# Patient Record
Sex: Female | Born: 1945 | Race: Black or African American | Hispanic: No | Marital: Married | Smoking: Never smoker
Health system: Southern US, Community
[De-identification: ages and names within clinical notes are randomized; demographics above are authoritative.]

## PROBLEM LIST (undated history)

## (undated) DIAGNOSIS — F419 Anxiety disorder, unspecified: Secondary | ICD-10-CM

## (undated) DIAGNOSIS — R55 Syncope and collapse: Secondary | ICD-10-CM

## (undated) DIAGNOSIS — I480 Paroxysmal atrial fibrillation: Secondary | ICD-10-CM

## (undated) DIAGNOSIS — I499 Cardiac arrhythmia, unspecified: Secondary | ICD-10-CM

## (undated) DIAGNOSIS — Z950 Presence of cardiac pacemaker: Secondary | ICD-10-CM

## (undated) DIAGNOSIS — G4730 Sleep apnea, unspecified: Secondary | ICD-10-CM

## (undated) DIAGNOSIS — F329 Major depressive disorder, single episode, unspecified: Secondary | ICD-10-CM

## (undated) DIAGNOSIS — F3289 Other specified depressive episodes: Secondary | ICD-10-CM

## (undated) DIAGNOSIS — L509 Urticaria, unspecified: Secondary | ICD-10-CM

## (undated) DIAGNOSIS — I495 Sick sinus syndrome: Secondary | ICD-10-CM

## (undated) DIAGNOSIS — I519 Heart disease, unspecified: Secondary | ICD-10-CM

## (undated) HISTORY — DX: Heart disease, unspecified: I51.9

## (undated) HISTORY — DX: Presence of cardiac pacemaker: Z95.0

## (undated) HISTORY — DX: Syncope and collapse: R55

## (undated) HISTORY — DX: Major depressive disorder, single episode, unspecified: F32.9

## (undated) HISTORY — DX: Cardiac arrhythmia, unspecified: I49.9

## (undated) HISTORY — PX: PACEMAKER IMPLANT: EP1218

## (undated) HISTORY — DX: Other specified depressive episodes: F32.89

---

## 1987-12-11 HISTORY — PX: BREAST BIOPSY: SHX20

## 2002-12-10 HISTORY — PX: CARDIAC CATHETERIZATION: SHX172

## 2003-10-21 HISTORY — PX: INSERT / REPLACE / REMOVE PACEMAKER: SUR710

## 2012-05-06 ENCOUNTER — Ambulatory Visit: Payer: Self-pay | Admitting: Family Medicine

## 2012-05-06 ENCOUNTER — Emergency Department: Payer: Self-pay | Admitting: Emergency Medicine

## 2012-08-06 ENCOUNTER — Encounter: Payer: Self-pay | Admitting: Internal Medicine

## 2012-08-14 ENCOUNTER — Encounter: Payer: Self-pay | Admitting: Internal Medicine

## 2012-08-14 ENCOUNTER — Ambulatory Visit (INDEPENDENT_AMBULATORY_CARE_PROVIDER_SITE_OTHER): Payer: Medicare Other | Admitting: Internal Medicine

## 2012-08-14 VITALS — BP 130/82 | HR 60 | Ht 64.0 in | Wt 193.8 lb

## 2012-08-14 DIAGNOSIS — I442 Atrioventricular block, complete: Secondary | ICD-10-CM

## 2012-08-14 DIAGNOSIS — R55 Syncope and collapse: Secondary | ICD-10-CM

## 2012-08-14 DIAGNOSIS — I4891 Unspecified atrial fibrillation: Secondary | ICD-10-CM

## 2012-08-14 DIAGNOSIS — I495 Sick sinus syndrome: Secondary | ICD-10-CM | POA: Insufficient documentation

## 2012-08-14 DIAGNOSIS — T782XXA Anaphylactic shock, unspecified, initial encounter: Secondary | ICD-10-CM

## 2012-08-14 DIAGNOSIS — I499 Cardiac arrhythmia, unspecified: Secondary | ICD-10-CM

## 2012-08-14 DIAGNOSIS — I519 Heart disease, unspecified: Secondary | ICD-10-CM

## 2012-08-14 DIAGNOSIS — Z950 Presence of cardiac pacemaker: Secondary | ICD-10-CM

## 2012-08-14 HISTORY — DX: Presence of cardiac pacemaker: Z95.0

## 2012-08-14 LAB — PACEMAKER DEVICE OBSERVATION
AL THRESHOLD: 0.8 V
RV LEAD THRESHOLD: 1 V

## 2012-08-14 NOTE — Progress Notes (Signed)
.  skse    History and Physical  Patient ID: Denise [name] MRN: [id number], SOB: Sep 07, 1946 66 y.o. Date of Encounter: 08/14/2012, 3:52 PM  Primary Physician: Redgie Grayer, MD Primary Cardiologist: new  Primary Electrophysiologist:  New   Chief Complaint: establish  History of Present Illness: Denise [name] is a 66 y.o. female  to establish pacemaker followup.  She underwent device implantation in [place] in 2004 following an episode of syncope. She has been on disopyramide for a number of years before then and remains on it now. She is not clear indication. We have records from [facility] about 7 years ago that she is on Coumadin transiently for atrial fibrillation. She is unaware of this diagnosis. She currently takes aspirin.  She denies dry eyes or dry mouth. He has no problems with exercise tolerance. Thromboembolic risk factors are notable for hypertension-1 age1 gender-1 for CHADS-VASc score of 3.  He denies chest pain shortness of breath peripheral edema nocturnal dyspnea or orthopnea.    Past Medical History  Diagnosis Date  . Syncope and collapse   . Depressive disorder, not elsewhere classified   . Heart disease, unspecified   . Arrhythmia     a-fib     Past Surgical History  Procedure Date  . Cardiac catheterization 2004    [place]  . Insert / replace / remove pacemaker 10/21/2003    [place]/ [employer]      Current Outpatient Prescriptions  Medication Sig Dispense Refill  . aspirin 81 MG tablet Take 81 mg by mouth daily.      . disopyramide (NORPACE) 100 MG capsule Take 100 mg by mouth 2 (two) times daily.         Allergies: Allergies  Allergen Reactions  . Penicillins      History  Substance Use Topics  . Smoking status: Never Smoker   . Smokeless tobacco: Not on file  . Alcohol Use: No      Family History  Problem Relation Age of Onset  . Hypertension Mother   . Hypertension Sister   . Hypertension Sister       ROS:  Please see the history of  present illness.   Negative except arthritis, transient numbness in her right pinkie and on her right thigh  All other systems reviewed and negative.   Vital Signs: Blood pressure 130/82, pulse 60, height 5\' 4"  (1.626 m), weight 193 lb 12 oz (87.884 kg).  PHYSICAL EXAM: General:  Well nourished, well developed female in no acute distress\  HEENT: normal Lymph: no adenopathy Neck: no JVD Endocrine:  No thryomegaly Vascular: No carotid bruits; FA pulses 2+ bilaterally without bruits Cardiac:  normal S1, S2; RRR; no murmur Back: without kyphosis/scoliosis, no CVA tenderness Lungs:  clear to auscultation bilaterally, no wheezing, rhonchi or rales Abd: soft, nontender, no hepatomegaly Ext: no edema Musculoskeletal:  No deformities, BUE and BLE strength normal and equal Skin: warm and dry Neuro:  CNs 2-12 intact, no focal abnormalities noted Psych:  Normal affect   EKG:   Sinus rhythm at 60 Intervals 13/09/39       ASSESSMENT AND PLAN:

## 2012-08-14 NOTE — Assessment & Plan Note (Signed)
It is not clear to me whether she is atrial fibrillation or not. Her disopyramide is probably for that; the dose is almost homeopathic. Her QT is tolerated it adequately  I don't know her well so we will continue her current course.  We also discussed the importance of anticoagulation; however, given the paucity of atrial fibrillation identified thus far her device I don't think is appropriate to make that change.Marland Kitchen

## 2012-08-14 NOTE — Assessment & Plan Note (Signed)
Interrogation of her pacemaker demonstrates normal left ventricular function with evidence of chronotropic incompetence and 60% atrial pacing. She has had insignificant atrial fibrillation i.e. 24 minutes over the last 3 years. Is not even clear that is atrial fibrillation.

## 2012-08-14 NOTE — Patient Instructions (Addendum)
Your physician wants you to follow-up in: 4 months with Dr. Klein. You will receive a reminder letter in the mail two months in advance. If you don't receive a letter, please call our office to schedule the follow-up appointment.    

## 2012-08-14 NOTE — Assessment & Plan Note (Signed)
Atrially paced atrially paced 60% of the time

## 2012-08-14 NOTE — Assessment & Plan Note (Signed)
The patient had allergic reaction with hives and her throat closing off. She thinks is related to PVCs. I've recommended that she see an allergist for clarification.

## 2012-08-22 ENCOUNTER — Ambulatory Visit: Payer: Self-pay | Admitting: Family Medicine

## 2012-12-10 ENCOUNTER — Emergency Department: Payer: Self-pay | Admitting: Emergency Medicine

## 2012-12-10 LAB — CBC
MCHC: 32.5 g/dL (ref 32.0–36.0)
MCV: 92 fL (ref 80–100)
Platelet: 135 10*3/uL — ABNORMAL LOW (ref 150–440)
RBC: 4.27 10*6/uL (ref 3.80–5.20)
WBC: 3 10*3/uL — ABNORMAL LOW (ref 3.6–11.0)

## 2012-12-10 LAB — BASIC METABOLIC PANEL
Calcium, Total: 8.1 mg/dL — ABNORMAL LOW (ref 8.5–10.1)
EGFR (African American): 58 — ABNORMAL LOW
EGFR (Non-African Amer.): 50 — ABNORMAL LOW
Osmolality: 286 (ref 275–301)
Potassium: 4.2 mmol/L (ref 3.5–5.1)
Sodium: 141 mmol/L (ref 136–145)

## 2012-12-16 ENCOUNTER — Ambulatory Visit (INDEPENDENT_AMBULATORY_CARE_PROVIDER_SITE_OTHER): Payer: Medicare Other | Admitting: Internal Medicine

## 2012-12-16 ENCOUNTER — Encounter: Payer: Self-pay | Admitting: Internal Medicine

## 2012-12-16 VITALS — BP 106/74 | HR 60 | Ht 64.0 in | Wt 198.5 lb

## 2012-12-16 DIAGNOSIS — I495 Sick sinus syndrome: Secondary | ICD-10-CM

## 2012-12-16 DIAGNOSIS — I4891 Unspecified atrial fibrillation: Secondary | ICD-10-CM

## 2012-12-16 DIAGNOSIS — Z950 Presence of cardiac pacemaker: Secondary | ICD-10-CM

## 2012-12-16 LAB — PACEMAKER DEVICE OBSERVATION
AL IMPEDENCE PM: 430 Ohm
AL THRESHOLD: 0.7 V
ATRIAL PACING PM: 58
RV LEAD IMPEDENCE PM: 780 Ohm
RV LEAD THRESHOLD: 1.1 V

## 2012-12-16 NOTE — Assessment & Plan Note (Signed)
The patient's device was interrogated.  The information was reviewed. No changes were made in the programming.    

## 2012-12-16 NOTE — Assessment & Plan Note (Addendum)
No interval atrial fibrillation detected by her device  She takes disopyramide. We will leave her on her for right now. Her QT interval is within range. I have discussed the importance of QT prolonging drugs and the need for active surveillance of both physicians and pharmacists. I've given her the websites, QT drugs.org.

## 2012-12-16 NOTE — Assessment & Plan Note (Signed)
55% atrial pacing with reasonable heart rate excursion

## 2012-12-16 NOTE — Progress Notes (Signed)
Patient Care Team: Amy Sapp as PCP - General (Family Medicine)   HPI  Denise [name] is a 67 y.o. female Seen in followup for pacemaker implanted 2004 for syncope. She also has a history of atrial fibrillation; when we met her in September she was not on anticoagulation. She was taking disopyramide for this although the dose is really quite low.  She has had no intercurrent afib; no sob outside of a recent cold for which ABx prescribed  Past Medical History  Diagnosis Date  . Syncope and collapse   . Depressive disorder, not elsewhere classified   . Heart disease, unspecified   . Arrhythmia     a-fib  . Pacemaker-[employer] 08/14/2012    Past Surgical History  Procedure Date  . Cardiac catheterization 2004    [place]  . Insert / replace / remove pacemaker 10/21/2003    [place]/ [employer]    Current Outpatient Prescriptions  Medication Sig Dispense Refill  . aspirin 81 MG tablet Take 81 mg by mouth daily.      . disopyramide (NORPACE) 100 MG capsule Take 100 mg by mouth 2 (two) times daily.      Marland Kitchen HYDROCODONE-CHLORPHENIRAMINE PO Take by mouth every 12 (twelve) hours as needed.      Marland Kitchen levofloxacin (LEVAQUIN) 500 MG tablet Take 500 mg by mouth daily.        Allergies  Allergen Reactions  . Penicillins     Review of Systems negative except from HPI and PMH  Physical Exam BP 106/74  Pulse 60  Ht 5\' 4"  (1.626 m)  Wt 198 lb 8 oz (90.039 kg)  BMI 34.07 kg/m2 Well developed and well nourished in no acute distress HENT normal E scleral and icterus clear Neck Supple JVP flat; carotids brisk and full Clear to ausculation  Regular rate and rhythm, no murmurs gallops or rub Soft with active bowel sounds No clubbing cyanosis none Edema Alert and oriented, grossly normal motor and sensory function Skin Warm and Dry  ECG demonstrates sinus rhythm at 60 Intervals 11/17/36 nonspecific T-wave changes  Assessment and  Plan

## 2012-12-16 NOTE — Patient Instructions (Addendum)
Your physician wants you to follow-up in: 6 months with Dr. Graciela Husbands and Device check. You will receive a reminder letter in the mail two months in advance. If you don't receive a letter, please call our office to schedule the follow-up appointment.

## 2013-01-21 ENCOUNTER — Ambulatory Visit: Payer: Self-pay | Admitting: Pain Medicine

## 2013-02-03 ENCOUNTER — Ambulatory Visit: Payer: Self-pay | Admitting: Pain Medicine

## 2013-02-03 LAB — SEDIMENTATION RATE: Erythrocyte Sed Rate: 8 mm/hr (ref 0–30)

## 2013-04-21 ENCOUNTER — Ambulatory Visit: Payer: Self-pay | Admitting: Pain Medicine

## 2013-05-11 ENCOUNTER — Ambulatory Visit: Payer: Self-pay | Admitting: Pain Medicine

## 2013-06-18 ENCOUNTER — Encounter: Payer: Self-pay | Admitting: Internal Medicine

## 2013-06-18 ENCOUNTER — Ambulatory Visit (INDEPENDENT_AMBULATORY_CARE_PROVIDER_SITE_OTHER): Payer: Medicare Other | Admitting: *Deleted

## 2013-06-18 DIAGNOSIS — I495 Sick sinus syndrome: Secondary | ICD-10-CM

## 2013-06-18 LAB — PACEMAKER DEVICE OBSERVATION
AL IMPEDENCE PM: 410 Ohm
AL THRESHOLD: 0.6 V
DEVICE MODEL PM: 110552
RV LEAD AMPLITUDE: 12 mv

## 2013-06-18 MED ORDER — DISOPYRAMIDE PHOSPHATE 100 MG PO CAPS: 100.0000 mg | ORAL_CAPSULE | Freq: Two times a day (BID) | ORAL | Status: DC

## 2013-06-18 NOTE — Progress Notes (Signed)
PPM check in office. 

## 2013-06-18 NOTE — Telephone Encounter (Signed)
Left message for call back.

## 2013-07-15 ENCOUNTER — Other Ambulatory Visit: Payer: Self-pay

## 2013-10-13 ENCOUNTER — Emergency Department: Payer: Self-pay | Admitting: Emergency Medicine

## 2013-10-15 ENCOUNTER — Other Ambulatory Visit: Payer: Self-pay

## 2013-10-22 ENCOUNTER — Ambulatory Visit: Payer: Self-pay | Admitting: Family Medicine

## 2013-11-25 DIAGNOSIS — M5412 Radiculopathy, cervical region: Secondary | ICD-10-CM | POA: Insufficient documentation

## 2013-11-25 DIAGNOSIS — G894 Chronic pain syndrome: Secondary | ICD-10-CM | POA: Insufficient documentation

## 2013-11-25 DIAGNOSIS — M4302 Spondylolysis, cervical region: Secondary | ICD-10-CM | POA: Insufficient documentation

## 2013-12-22 ENCOUNTER — Encounter: Payer: Self-pay | Admitting: *Deleted

## 2014-01-27 ENCOUNTER — Encounter: Payer: Medicare Other | Admitting: Internal Medicine

## 2014-02-02 ENCOUNTER — Encounter: Payer: Medicare Other | Admitting: Internal Medicine

## 2014-06-21 ENCOUNTER — Telehealth: Payer: Self-pay | Admitting: Internal Medicine

## 2014-06-21 NOTE — Telephone Encounter (Signed)
Pt came ins saying she is having sharp pains in back of head, comes and goes. engegy is very low, aches of and on everywhere. on left side behind ear and pacer feels like it has sinked in, wanted a sooner apt for doctor, and or a pacer check. Please advise

## 2014-06-22 ENCOUNTER — Encounter: Payer: Self-pay | Admitting: Internal Medicine

## 2014-06-22 ENCOUNTER — Ambulatory Visit (INDEPENDENT_AMBULATORY_CARE_PROVIDER_SITE_OTHER): Payer: Medicare Other | Admitting: Internal Medicine

## 2014-06-22 VITALS — BP 156/92 | HR 60 | Ht 64.0 in | Wt 189.0 lb

## 2014-06-22 DIAGNOSIS — R52 Pain, unspecified: Secondary | ICD-10-CM

## 2014-06-22 DIAGNOSIS — I4891 Unspecified atrial fibrillation: Secondary | ICD-10-CM

## 2014-06-22 DIAGNOSIS — I495 Sick sinus syndrome: Secondary | ICD-10-CM

## 2014-06-22 LAB — MDC_IDC_ENUM_SESS_TYPE_INCLINIC
Battery Remaining Longevity: 24 mo
Brady Statistic RV Percent Paced: 4 %
Implantable Pulse Generator Serial Number: 110552
Lead Channel Impedance Value: 420 Ohm
Lead Channel Impedance Value: 720 Ohm
Lead Channel Pacing Threshold Amplitude: 0.9 V
Lead Channel Pacing Threshold Pulse Width: 0.5 ms
Lead Channel Pacing Threshold Pulse Width: 0.5 ms
MDC IDC MSMT LEADCHNL RA PACING THRESHOLD AMPLITUDE: 0.9 V
MDC IDC MSMT LEADCHNL RA SENSING INTR AMPL: 3.2 mV
MDC IDC MSMT LEADCHNL RV SENSING INTR AMPL: 12 mV
MDC IDC STAT BRADY RA PERCENT PACED: 61 %

## 2014-06-22 NOTE — Patient Instructions (Signed)
Your physician has recommended you make the following change in your medication:  Stop Disopyramide    Your physician wants you to follow-up in: 6 months. You will receive a reminder letter in the mail two months in advance. If you don't receive a letter, please call our office to schedule the follow-up appointment.

## 2014-06-22 NOTE — Progress Notes (Signed)
Patient Care Team: Maryland Pink, MD as PCP - General (Family Medicine)   HPI  Denise [name] is a 68 y.o. female Seen in followup for pacemaker implanted 2004 for syncope. She also has a history of atrial fibrillation; when we met her in September she was not on anticoagulation. She was taking disopyramide for this although the dose is really quite low.  She has had no intercurrent afib;   She feels overall ; she has less energy. She falls asleep during the day. She has sleep disordered breathing. She denies chest pain.  She is concerned about discoloration lateral and superior to her device  She also complains of soreness in her muscles in her left side but not her right; there no associated headaches  Past Medical History  Diagnosis Date  . Syncope and collapse   . Depressive disorder, not elsewhere classified   . Heart disease, unspecified   . Arrhythmia     a-fib  . Pacemaker-[employer] 08/14/2012    Past Surgical History  Procedure Laterality Date  . Cardiac catheterization  2004    [place]  . Insert / replace / remove pacemaker  10/21/2003    [place]/ [employer]    Current Outpatient Prescriptions  Medication Sig Dispense Refill  . aspirin 81 MG tablet Take 81 mg by mouth daily.      . clobetasol cream (TEMOVATE) 8.08 % Apply 1 application topically as needed.       . disopyramide (NORPACE) 100 MG capsule Take 1 capsule (100 mg total) by mouth 2 (two) times daily.  60 capsule  3  . mupirocin ointment (BACTROBAN) 2 % Apply 1 application topically as needed.        No current facility-administered medications for this visit.    Allergies  Allergen Reactions  . Penicillins     Review of Systems negative except from HPI and PMH  Physical Exam BP 156/92  Pulse 60  Ht _0  (1.626 m)  Wt 189 lb (85.73 kg)  BMI 32.43 kg/m2 Well developed and well nourished in no acute distress HENT normal E scleral and icterus clear Neck Supple JVP flat;  carotids brisk and full Clear to ausculation  Device pocket well healed; without hematoma or erythema.  There is no tethering *Regular rate and rhythm, no murmurs gallops or rub Soft with active bowel sounds No clubbing cyanosis  Edema Alert and oriented, grossly normal motor and sensory function Skin Warm and Dry; may be some minimal discoloration superior and lateral to her device. There is no warmth or tenderness  ECG demonstrates atrial paced rhythm at 60 Intervals 14/07/39 Otherwise normal  Assessment and  Plan  Sinus node dysfunction  Atrial fibrillation  Pacemaker-[employer] The patient's device was interrogated.  The information was reviewed. No changes were made in the programming.     Sleep disordered breathing/daytime fatigue  Asymmetric myalgias  Her device function is normal. There has been no intercurrent atrial fibrillation. We will discontinue her disopyramide. She is currently taking only aspirin; in the event that she would have atrial fibrillation documented, we would anticipate initiation of anticoagulation.  She has significant complaints of fatigue and somnolence. She does have sleep-disordered breathing and I recommended that she followup with her PCP regarding a sleep study.  With her myalgias, I wonder whether she could have myalgia rheumatica; I am not sure whether this is associated with asymmetry though and I will defer this to Dr. Kary Kos and his expertise.

## 2014-07-13 ENCOUNTER — Encounter: Payer: Medicare Other | Admitting: Internal Medicine

## 2014-11-17 ENCOUNTER — Ambulatory Visit: Payer: Self-pay | Admitting: Family Medicine

## 2015-01-13 ENCOUNTER — Encounter: Payer: Self-pay | Admitting: *Deleted

## 2015-02-03 ENCOUNTER — Encounter: Payer: Self-pay | Admitting: *Deleted

## 2015-03-10 ENCOUNTER — Encounter: Payer: Self-pay | Admitting: *Deleted

## 2015-04-06 ENCOUNTER — Encounter: Payer: Self-pay | Admitting: *Deleted

## 2015-05-13 ENCOUNTER — Encounter: Payer: Self-pay | Admitting: *Deleted

## 2015-05-27 ENCOUNTER — Encounter: Payer: Self-pay | Admitting: *Deleted

## 2015-07-05 ENCOUNTER — Ambulatory Visit (INDEPENDENT_AMBULATORY_CARE_PROVIDER_SITE_OTHER): Payer: Medicare Other | Admitting: Internal Medicine

## 2015-07-05 ENCOUNTER — Encounter: Payer: Self-pay | Admitting: Internal Medicine

## 2015-07-05 VITALS — BP 130/78 | HR 60 | Ht 64.0 in | Wt 190.8 lb

## 2015-07-05 DIAGNOSIS — Z950 Presence of cardiac pacemaker: Secondary | ICD-10-CM

## 2015-07-05 DIAGNOSIS — I495 Sick sinus syndrome: Secondary | ICD-10-CM

## 2015-07-05 DIAGNOSIS — I4891 Unspecified atrial fibrillation: Secondary | ICD-10-CM | POA: Diagnosis not present

## 2015-07-05 LAB — CUP PACEART INCLINIC DEVICE CHECK
Brady Statistic RA Percent Paced: 64 %
Brady Statistic RV Percent Paced: 4 %
Lead Channel Impedance Value: 730 Ohm
Lead Channel Pacing Threshold Amplitude: 0.9 V
Lead Channel Pacing Threshold Pulse Width: 0.5 ms
Lead Channel Sensing Intrinsic Amplitude: 12 mV
Lead Channel Sensing Intrinsic Amplitude: 2.8 mV
Lead Channel Setting Pacing Amplitude: 2 V
Lead Channel Setting Pacing Pulse Width: 0.5 ms
Lead Channel Setting Sensing Sensitivity: 2.5 mV
MDC IDC MSMT LEADCHNL RA IMPEDANCE VALUE: 430 Ohm
MDC IDC MSMT LEADCHNL RA PACING THRESHOLD AMPLITUDE: 1.2 V
MDC IDC MSMT LEADCHNL RV PACING THRESHOLD PULSEWIDTH: 0.5 ms
MDC IDC SESS DTM: 20160726040000
MDC IDC SET LEADCHNL RV PACING AMPLITUDE: 2.5 V
Pulse Gen Model: 1294
Pulse Gen Serial Number: 110552

## 2015-07-05 NOTE — Progress Notes (Addendum)
      Patient Care Team: Maryland Pink, MD as PCP - General (Family Medicine)   HPI  Denise [name] is a 69 y.o. female Seen in followup for pacemaker implanted 2004 for syncope. She also has a history of atrial fibrillation but has not had any documented recently    She feels overall pretty well The patient denies chest pain, shortness of breath, nocturnal dyspnea, orthopnea or peripheral edema.  There have been no palpitations, lightheadedness or syncope.   She is anxious regarding device generator replacement  Past Medical History  Diagnosis Date  . Syncope and collapse   . Depressive disorder, not elsewhere classified   . Heart disease, unspecified   . Arrhythmia     a-fib  . Pacemaker-[employer] 08/14/2012    Past Surgical History  Procedure Laterality Date  . Cardiac catheterization  2004    [place]  . Insert / replace / remove pacemaker  10/21/2003    [place]/ [employer]    Current Outpatient Prescriptions  Medication Sig Dispense Refill  . aspirin 81 MG tablet Take 81 mg by mouth daily.    . clobetasol cream (TEMOVATE) 7.34 % Apply 1 application topically as needed.     . mupirocin ointment (BACTROBAN) 2 % Apply 1 application topically as needed.      No current facility-administered medications for this visit.    Allergies  Allergen Reactions  . Penicillins     Review of Systems negative except from HPI and PMH  Physical Exam BP 130/78 mmHg  Pulse 60  Ht 5\' 4"  (1.626 m)  Wt 190 lb 12 oz (86.524 kg)  BMI 32.73 kg/m2 Well developed and well nourished in no acute distress HENT normal E scleral and icterus clear Neck Supple JVP flat; carotids brisk and full Clear to ausculation  Device pocket well healed; without hematoma or erythema.  There is no tethering *Regular rate and rhythm, no murmurs gallops or rub Soft with active bowel sounds No clubbing cyanosis  Edema Alert and oriented, grossly normal motor and sensory function Skin Warm and  Dry; may be some minimal discoloration superior and lateral to her device. There is no warmth or tenderness   ECG  nsr ( prob a pacing)  60 13/08/38  Assessment and  Plan  Sinus node dysfunction  Atrial fibrillation  Pacemaker-[employer] The patient's device was interrogated.  The information was reviewed. No changes were made in the programming.     Sleep disordered breathing/daytime fatigue   Her device function is normal. There has been no intercurrent atrial fibrillation.   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

## 2015-07-05 NOTE — Patient Instructions (Signed)
Medication Instructions:  Your physician recommends that you continue on your current medications as directed. Please refer to the Current Medication list given to you today.   Labwork: none  Testing/Procedures: none  Follow-Up: Monthly battery check with Juanda Crumble in [employer] in [place] office   Any Other Special Instructions Will Be Listed Below (If Applicable).

## 2015-08-02 ENCOUNTER — Telehealth: Payer: Self-pay | Admitting: *Deleted

## 2015-08-02 NOTE — Telephone Encounter (Signed)
Patient called and is worried about her pacemaker battery. She feels like she needs to be check. Please call her back.  Her appt got reschedule to a later date.

## 2015-08-02 NOTE — Telephone Encounter (Signed)
Pt made aware that next pacemaker battery check will be 08/22/15 and that she will be safe based on her device longevity. Pt upset because she was told that she needed to be checked once a month due to her battery being low. Says she is taking [place] on our word.

## 2015-08-02 NOTE — Telephone Encounter (Signed)
Forward to device clinic in [place]

## 2015-08-22 ENCOUNTER — Ambulatory Visit (INDEPENDENT_AMBULATORY_CARE_PROVIDER_SITE_OTHER): Payer: Medicare Other | Admitting: *Deleted

## 2015-08-22 DIAGNOSIS — Z45010 Encounter for checking and testing of cardiac pacemaker pulse generator [battery]: Secondary | ICD-10-CM

## 2015-08-22 LAB — CUP PACEART INCLINIC DEVICE CHECK
Brady Statistic RA Percent Paced: 64 %
Date Time Interrogation Session: 20160912040000
Lead Channel Setting Pacing Amplitude: 2 V
Lead Channel Setting Pacing Amplitude: 2.5 V
Lead Channel Setting Pacing Pulse Width: 0.5 ms
Lead Channel Setting Sensing Sensitivity: 2.5 mV
MDC IDC PG SERIAL: 110552
MDC IDC STAT BRADY RV PERCENT PACED: 3 %

## 2015-08-22 NOTE — Progress Notes (Signed)
Battery check only. Remaining longevity <0.32yrs. ROV w/ [place] device clinic 09/20/15, next billable Nov.

## 2015-08-29 ENCOUNTER — Encounter: Payer: Self-pay | Admitting: Internal Medicine

## 2015-09-20 ENCOUNTER — Encounter: Payer: Self-pay | Admitting: Internal Medicine

## 2015-09-20 ENCOUNTER — Ambulatory Visit (INDEPENDENT_AMBULATORY_CARE_PROVIDER_SITE_OTHER): Payer: Medicare Other | Admitting: *Deleted

## 2015-09-20 DIAGNOSIS — Z45010 Encounter for checking and testing of cardiac pacemaker pulse generator [battery]: Secondary | ICD-10-CM

## 2015-09-20 DIAGNOSIS — Z23 Encounter for immunization: Secondary | ICD-10-CM

## 2015-09-20 LAB — CUP PACEART INCLINIC DEVICE CHECK
Brady Statistic AP VP Percent: 3 %
Brady Statistic AS VP Percent: 1 %
Brady Statistic AS VS Percent: 42 %
Date Time Interrogation Session: 20161011100432
Lead Channel Setting Pacing Amplitude: 2 V
Lead Channel Setting Pacing Amplitude: 2.5 V
Lead Channel Setting Pacing Pulse Width: 0.5 ms
Lead Channel Setting Sensing Sensitivity: 2.5 mV
MDC IDC STAT BRADY AP VS PERCENT: 54 %
Pulse Gen Serial Number: 110552

## 2015-09-20 NOTE — Progress Notes (Signed)
Battery check only. Remaining longevity <0.66yrs. ROV w/ [place] device clinic 10/25/15, billable.

## 2015-10-25 ENCOUNTER — Ambulatory Visit: Payer: Medicare Other | Admitting: *Deleted

## 2015-10-25 ENCOUNTER — Encounter: Payer: Self-pay | Admitting: Internal Medicine

## 2015-10-25 DIAGNOSIS — Z45010 Encounter for checking and testing of cardiac pacemaker pulse generator [battery]: Secondary | ICD-10-CM

## 2015-10-25 LAB — CUP PACEART INCLINIC DEVICE CHECK
Implantable Lead Implant Date: 20041111
Implantable Lead Location: 753859
Implantable Lead Location: 753860
Implantable Lead Model: 4473
Implantable Lead Serial Number: 209644
Implantable Lead Serial Number: 420854
MDC IDC LEAD IMPLANT DT: 20041111
MDC IDC LEAD MODEL: 4088
MDC IDC PG SERIAL: 110552
MDC IDC SESS DTM: 20161115141040

## 2015-10-25 NOTE — Progress Notes (Signed)
Pacer check in clinic by Dr. Caryl Comes. See scanned device report.

## 2015-10-25 NOTE — Progress Notes (Unsigned)
Pt here for battery check  Device implanted 2004 for syncope  <0.5 years  F/u one month

## 2015-11-14 ENCOUNTER — Encounter: Payer: Self-pay | Admitting: Internal Medicine

## 2015-11-29 ENCOUNTER — Ambulatory Visit (INDEPENDENT_AMBULATORY_CARE_PROVIDER_SITE_OTHER): Payer: Medicare Other | Admitting: *Deleted

## 2015-11-29 DIAGNOSIS — Z950 Presence of cardiac pacemaker: Secondary | ICD-10-CM | POA: Diagnosis not present

## 2015-11-29 DIAGNOSIS — I495 Sick sinus syndrome: Secondary | ICD-10-CM | POA: Diagnosis not present

## 2015-11-29 LAB — CUP PACEART INCLINIC DEVICE CHECK
Brady Statistic RA Percent Paced: 58 %
Date Time Interrogation Session: 20161220085007
Implantable Lead Implant Date: 20041111
Implantable Lead Location: 753859
Implantable Lead Location: 753860
Implantable Lead Model: 4088
Implantable Lead Model: 4473
Lead Channel Setting Pacing Amplitude: 2 V
Lead Channel Setting Pacing Amplitude: 2.5 V
MDC IDC LEAD IMPLANT DT: 20041111
MDC IDC LEAD SERIAL: 209644
MDC IDC LEAD SERIAL: 420854
MDC IDC SET LEADCHNL RV PACING PULSEWIDTH: 0.5 ms
MDC IDC SET LEADCHNL RV SENSING SENSITIVITY: 2.5 mV
MDC IDC STAT BRADY RV PERCENT PACED: 3 %
Pulse Gen Model: 1294
Pulse Gen Serial Number: 110552

## 2015-11-29 NOTE — Progress Notes (Signed)
Battery check only. Remaining longevity <0.68yrs. ROV w/ [place] device clinic 01/03/16, next billable March.

## 2015-12-26 ENCOUNTER — Encounter: Payer: Self-pay | Admitting: Internal Medicine

## 2016-01-10 ENCOUNTER — Encounter: Payer: Self-pay | Admitting: Internal Medicine

## 2016-01-10 ENCOUNTER — Ambulatory Visit (INDEPENDENT_AMBULATORY_CARE_PROVIDER_SITE_OTHER): Payer: Medicare Other | Admitting: *Deleted

## 2016-01-10 DIAGNOSIS — I495 Sick sinus syndrome: Secondary | ICD-10-CM

## 2016-01-10 DIAGNOSIS — Z45010 Encounter for checking and testing of cardiac pacemaker pulse generator [battery]: Secondary | ICD-10-CM

## 2016-01-10 LAB — CUP PACEART INCLINIC DEVICE CHECK
Date Time Interrogation Session: 20170131050000
Implantable Lead Implant Date: 20041111
Implantable Lead Model: 4088
Implantable Lead Model: 4473
Lead Channel Setting Pacing Pulse Width: 0.5 ms
Lead Channel Setting Sensing Sensitivity: 2.5 mV
MDC IDC LEAD IMPLANT DT: 20041111
MDC IDC LEAD LOCATION: 753859
MDC IDC LEAD LOCATION: 753860
MDC IDC LEAD SERIAL: 209644
MDC IDC LEAD SERIAL: 420854
MDC IDC SET LEADCHNL RA PACING AMPLITUDE: 2 V
MDC IDC SET LEADCHNL RV PACING AMPLITUDE: 2.5 V
MDC IDC STAT BRADY RA PERCENT PACED: 56 %
MDC IDC STAT BRADY RV PERCENT PACED: 2 %
Pulse Gen Serial Number: 110552

## 2016-01-10 NOTE — Progress Notes (Signed)
Battery check only. Remaining longevity <0.5 years. ROV with device clinic in [place] on 02/21/16 (billable).

## 2016-02-21 ENCOUNTER — Ambulatory Visit (INDEPENDENT_AMBULATORY_CARE_PROVIDER_SITE_OTHER): Payer: Medicare Other | Admitting: *Deleted

## 2016-02-21 DIAGNOSIS — I495 Sick sinus syndrome: Secondary | ICD-10-CM | POA: Diagnosis not present

## 2016-02-21 DIAGNOSIS — Z950 Presence of cardiac pacemaker: Secondary | ICD-10-CM

## 2016-02-21 LAB — CUP PACEART INCLINIC DEVICE CHECK
Brady Statistic RA Percent Paced: 55 %
Implantable Lead Implant Date: 20041111
Implantable Lead Location: 753860
Implantable Lead Model: 4088
Implantable Lead Model: 4473
Implantable Lead Serial Number: 209644
Lead Channel Pacing Threshold Amplitude: 1.1 V
Lead Channel Sensing Intrinsic Amplitude: 3.1 mV
Lead Channel Setting Pacing Amplitude: 2.5 V
Lead Channel Setting Pacing Pulse Width: 0.5 ms
MDC IDC LEAD IMPLANT DT: 20041111
MDC IDC LEAD LOCATION: 753859
MDC IDC LEAD SERIAL: 420854
MDC IDC MSMT LEADCHNL RA IMPEDANCE VALUE: 420 Ohm
MDC IDC MSMT LEADCHNL RA PACING THRESHOLD PULSEWIDTH: 0.5 ms
MDC IDC MSMT LEADCHNL RV IMPEDANCE VALUE: 770 Ohm
MDC IDC MSMT LEADCHNL RV PACING THRESHOLD AMPLITUDE: 1 V
MDC IDC MSMT LEADCHNL RV PACING THRESHOLD PULSEWIDTH: 0.4 ms
MDC IDC MSMT LEADCHNL RV SENSING INTR AMPL: 12 mV — AB
MDC IDC SESS DTM: 20170314040000
MDC IDC SET LEADCHNL RA PACING AMPLITUDE: 2 V
MDC IDC SET LEADCHNL RV SENSING SENSITIVITY: 2.5 mV
MDC IDC STAT BRADY RV PERCENT PACED: 2 %
Pulse Gen Serial Number: 110552

## 2016-02-21 NOTE — Progress Notes (Signed)
Pacemaker check in clinic. Normal device function. Thresholds, sensing, impedances consistent with previous measurements. Device programmed to maximize longevity. No mode switch or high ventricular rates noted. Device programmed at appropriate safety margins. Histogram distribution appropriate for patient activity level. Device programmed to optimize intrinsic conduction. Battery at ERT as of 02/10/16, plan for ROV with [facility] on 02/23/16 to discuss gen change. Patient education completed.

## 2016-02-23 ENCOUNTER — Ambulatory Visit (INDEPENDENT_AMBULATORY_CARE_PROVIDER_SITE_OTHER): Payer: Medicare Other | Admitting: Internal Medicine

## 2016-02-23 ENCOUNTER — Encounter: Payer: Self-pay | Admitting: Internal Medicine

## 2016-02-23 VITALS — BP 140/90 | HR 60 | Ht 64.0 in | Wt 199.8 lb

## 2016-02-23 DIAGNOSIS — Z01818 Encounter for other preprocedural examination: Secondary | ICD-10-CM

## 2016-02-23 NOTE — Progress Notes (Signed)
Patient Care Team: Maryland Pink, MD as PCP - General (Family Medicine)   HPI  Denise [name] is a 70 y.o. female Seen in followup for pacemaker implanted 2004 for syncope. She also has a history of atrial fibrillation but has not had any documented recently    This patients CHA2DS2-VASc Score and unadjusted Ischemic Stroke Rate (% per year) is equal to 2.2 % stroke rate/year from a score of 2--gender/age  Above score calculated as 1 point each if present [CHF, HTN, DM, Vascular=MI/PAD/Aortic Plaque, Age if 65-74, or Female] Above score calculated as 2 points each if present [Age > 75, or Stroke/TIA/TE]  Her device is a generator replacement.  She also notes that over the last few months she has had problems with progressive dyspnea on exertion. It is unassociated with chest discomfort. She has not noted edema or nocturnal dyspnea.  Echocardiogram is from 2006 and was normal.   Past Medical History  Diagnosis Date  . Syncope and collapse   . Depressive disorder, not elsewhere classified   . Heart disease, unspecified   . Arrhythmia     a-fib  . Pacemaker-[employer] 08/14/2012    Past Surgical History  Procedure Laterality Date  . Cardiac catheterization  2004    [place]  . Insert / replace / remove pacemaker  10/21/2003    [place]/ [employer]    Current Outpatient Prescriptions  Medication Sig Dispense Refill  . aspirin 81 MG tablet Take 81 mg by mouth daily.    . clobetasol cream (TEMOVATE) [id number] % Apply 1 application topically as needed.     . diphenhydrAMINE (BENADRYL) 25 MG tablet Take 25 mg by mouth every 6 (six) hours as needed for itching or allergies.    Marland Kitchen EPINEPHrine 0.3 mg/0.3 mL IJ SOAJ injection Inject 0.3 mg into the muscle once. Allergic to red meat.    . mupirocin ointment (BACTROBAN) 2 % Apply 1 application topically as needed.      No current facility-administered medications for this visit.    Allergies  Allergen Reactions  . Meat  Extract Hives and Swelling    Red meat. Makes her throat close and hives breakout.  Marland Kitchen Penicillins     Review of Systems negative except from HPI and PMH  Physical Exam BP 140/90 mmHg  Pulse 60  Ht 5\' 4"  (1.626 m)  Wt 199 lb 12.8 oz (90.629 kg)  BMI 34.28 kg/m2 Well developed and well nourished in no acute distress HENT normal E scleral and icterus clear Neck Supple JVP flat; carotids brisk and full Clear to ausculation  Device pocket well healed; without hematoma or erythema.  There is no tethering *Regular rate and rhythm, no murmurs gallops or rub Soft with active bowel sounds No clubbing cyanosis  Edema Alert and oriented, grossly normal motor and sensory function Skin Warm and Dry; may be some minimal discoloration superior and lateral to her device. There is no warmth or tenderness   ECG  nsr ( prob a pacing)  60 13/08/38  Assessment and  Plan  Sinus node dysfunction  Atrial fibrillation  Pacemaker-[employer]    Dyspnea on exertion  The dyspnea is worse in the context of her obesity we have again discussed sleep apnea and its implications. Given the rapid acceleration of her events, excluding worsening LV function for coronary artery disease becomes important. These might have an implication of device generator replacement strategy. We will undertake a Lexiscan Myoview   We have  reviewed the benefits and risks of generator replacement.  These include but are not limited to lead fracture and infection.  The patient understands, agrees and is willing to proceed.

## 2016-02-23 NOTE — Patient Instructions (Addendum)
Medication Instructions:  Your physician recommends that you continue on your current medications as directed. Please refer to the Current Medication list given to you today.   Labwork: none  Testing/Procedures: Your physician has requested that you have a lexiscan myoview. For further information please visit [email or web address]. Please follow instruction sheet, as given.  [place]  Your caregiver has ordered a Stress Test with nuclear imaging. The purpose of this test is to evaluate the blood supply to your heart muscle. This procedure is referred to as a "Non-Invasive Stress Test." This is because other than having an IV started in your vein, nothing is inserted or "invades" your body. Cardiac stress tests are done to find areas of poor blood flow to the heart by determining the extent of coronary artery disease (CAD). Some patients exercise on a treadmill, which naturally increases the blood flow to your heart, while others who are  unable to walk on a treadmill due to physical limitations have a pharmacologic/chemical stress agent called Lexiscan . This medicine will mimic walking on a treadmill by temporarily increasing your coronary blood flow.   Please note: these test may take anywhere between 2-4 hours to complete  PLEASE REPORT TO [facility] AT THE FIRST DESK WILL DIRECT YOU WHERE TO GO  Date of Procedure:_____Monday, March 20_____________  Arrival Time for Procedure:___7:15am_________________  Instructions regarding medication:  You may take your morning medications with a sip of water.   PLEASE NOTIFY THE OFFICE AT LEAST 70 HOURS IN ADVANCE IF YOU ARE UNABLE TO KEEP YOUR APPOINTMENT.  [phone] AND  PLEASE NOTIFY NUCLEAR MEDICINE AT [facility] AT LEAST 24 HOURS IN ADVANCE IF YOU ARE UNABLE TO KEEP YOUR APPOINTMENT. [phone]  How to prepare for your Myoview test:  1. Do not eat or drink after midnight 2. No caffeine for 24 hours prior  to test 3. No smoking 24 hours prior to test. 4. Your medication may be taken with water.  If your doctor stopped a medication because of this test, do not take that medication. 5. Ladies, please do not wear dresses.  Skirts or pants are appropriate. Please wear a short sleeve shirt. 6. No perfume, cologne or lotion. 7. Wear comfortable walking shoes. No heels!           Gen change after lexi myoview. We will call you with details and date.   Follow-Up: Your physician recommends that you schedule a follow-up appointment after your gen change   Any Other Special Instructions Will Be Listed Below (If Applicable).     If you need a refill on your cardiac medications before your next appointment, please call your pharmacy.  Cardiac Nuclear Scanning A cardiac nuclear scan is used to check your heart for problems, such as the following:  A portion of the heart is not getting enough blood.  Part of the heart muscle has died, which happens with a heart attack.  The heart wall is not working normally.  In this test, a radioactive dye (tracer) is injected into your bloodstream. After the tracer has traveled to your heart, a scanning device is used to measure how much of the tracer is absorbed by or distributed to various areas of your heart. LET [facility] CARE PROVIDER KNOW ABOUT:  Any allergies you have.  All medicines you are taking, including vitamins, herbs, eye drops, creams, and over-the-counter medicines.  Previous problems you or members of your family have had with the use of anesthetics.  Any blood disorders you have.  Previous surgeries you have had.  Medical conditions you have.  RISKS AND COMPLICATIONS Generally, this is a safe procedure. However, as with any procedure, problems can occur. Possible problems include:  8. Serious chest pain. 9. Rapid heartbeat. 10. Sensation of warmth in your chest. This usually passes quickly. BEFORE THE PROCEDURE Ask your  health care provider about changing or stopping your regular medicines. PROCEDURE This procedure is usually done at a hospital and takes 2-4 hours.  An IV tube is inserted into one of your veins.  Your health care provider will inject a small amount of radioactive tracer through the tube.  You will then wait for 20-40 minutes while the tracer travels through your bloodstream.  You will lie down on an exam table so images of your heart can be taken. Images will be taken for about 15-20 minutes.  You will exercise on a treadmill or stationary bike. While you exercise, your heart activity will be monitored with an electrocardiogram (ECG), and your blood pressure will be checked.  If you are unable to exercise, you may be given a medicine to make your heart beat faster.  When blood flow to your heart has peaked, tracer will again be injected through the IV tube.  After 20-40 minutes, you will get back on the exam table and have more images taken of your heart.  When the procedure is over, your IV tube will be removed. AFTER THE PROCEDURE  You will likely be able to leave shortly after the test. Unless your health care provider tells you otherwise, you may return to your normal schedule, including diet, activities, and medicines.  Make sure you find out how and when you will get your test results.   This information is not intended to replace advice given to you by your health care provider. Make sure you discuss any questions you have with your health care provider.   Document Released: 12/21/2004 Document Revised: 12/01/2013 Document Reviewed: 11/04/2013 Elsevier Interactive Patient Education [employer].

## 2016-02-27 ENCOUNTER — Encounter
Admission: RE | Admit: 2016-02-27 | Discharge: 2016-02-27 | Disposition: A | Discharge status: Home / Self Care | Payer: Medicare Other | Source: Ambulatory Visit | Attending: Internal Medicine | Admitting: Internal Medicine

## 2016-02-27 DIAGNOSIS — Z01818 Encounter for other preprocedural examination: Secondary | ICD-10-CM

## 2016-02-27 DIAGNOSIS — I4891 Unspecified atrial fibrillation: Secondary | ICD-10-CM | POA: Insufficient documentation

## 2016-02-27 DIAGNOSIS — I519 Heart disease, unspecified: Secondary | ICD-10-CM | POA: Diagnosis not present

## 2016-02-27 DIAGNOSIS — Z950 Presence of cardiac pacemaker: Secondary | ICD-10-CM | POA: Insufficient documentation

## 2016-02-27 DIAGNOSIS — I495 Sick sinus syndrome: Secondary | ICD-10-CM | POA: Insufficient documentation

## 2016-02-27 LAB — NM MYOCAR MULTI W/SPECT W/WALL MOTION / EF
CHL CUP MPHR: 150 {beats}/min
CHL CUP NUCLEAR SDS: 7
CHL CUP NUCLEAR SRS: 0
CHL CUP NUCLEAR SSS: 4
CSEPHR: 52 %
Estimated workload: 1 METS
Exercise duration (min): 0 min
Exercise duration (sec): 0 s
LV sys vol: 17 mL
LVDIAVOL: 47 mL (ref 46–106)
NUC STRESS TID: 0.69
Peak HR: 78 {beats}/min
Rest HR: 60 {beats}/min

## 2016-02-27 MED ORDER — TECHNETIUM TC 99M SESTAMIBI - CARDIOLITE
12.9990 | Freq: Once | INTRAVENOUS | Status: AC | PRN
  Administered 2016-02-27: 12.999 via INTRAVENOUS

## 2016-02-27 MED ORDER — REGADENOSON 0.4 MG/5ML IV SOLN
0.4000 mg | Freq: Once | INTRAVENOUS | Status: AC
  Administered 2016-02-27: 0.4 mg via INTRAVENOUS

## 2016-02-27 MED ORDER — TECHNETIUM TC 99M SESTAMIBI - CARDIOLITE
32.3800 | Freq: Once | INTRAVENOUS | Status: AC | PRN
  Administered 2016-02-27: 32.38 via INTRAVENOUS

## 2016-03-06 ENCOUNTER — Encounter: Payer: Self-pay | Admitting: Internal Medicine

## 2016-03-21 ENCOUNTER — Encounter (HOSPITAL_COMMUNITY)
Admission: RE | Disposition: A | Discharge status: Home / Self Care | Payer: Self-pay | Source: Ambulatory Visit | Attending: Internal Medicine

## 2016-03-21 ENCOUNTER — Encounter (HOSPITAL_COMMUNITY): Payer: Self-pay | Admitting: Internal Medicine

## 2016-03-21 ENCOUNTER — Ambulatory Visit (HOSPITAL_COMMUNITY)
Admission: RE | Admit: 2016-03-21 | Discharge: 2016-03-21 | Disposition: A | Discharge status: Home / Self Care | Payer: Medicare Other | Source: Ambulatory Visit | Attending: Internal Medicine | Admitting: Internal Medicine

## 2016-03-21 DIAGNOSIS — Z45010 Encounter for checking and testing of cardiac pacemaker pulse generator [battery]: Secondary | ICD-10-CM | POA: Insufficient documentation

## 2016-03-21 DIAGNOSIS — I4891 Unspecified atrial fibrillation: Secondary | ICD-10-CM | POA: Diagnosis not present

## 2016-03-21 DIAGNOSIS — Z880 Allergy status to penicillin: Secondary | ICD-10-CM | POA: Insufficient documentation

## 2016-03-21 DIAGNOSIS — Z7982 Long term (current) use of aspirin: Secondary | ICD-10-CM | POA: Diagnosis not present

## 2016-03-21 DIAGNOSIS — I495 Sick sinus syndrome: Secondary | ICD-10-CM | POA: Diagnosis present

## 2016-03-21 DIAGNOSIS — Z950 Presence of cardiac pacemaker: Secondary | ICD-10-CM | POA: Diagnosis present

## 2016-03-21 HISTORY — PX: EP IMPLANTABLE DEVICE: SHX172B

## 2016-03-21 LAB — CBC
HEMATOCRIT: 40.5 % (ref 36.0–46.0)
HEMOGLOBIN: 13 g/dL (ref 12.0–15.0)
MCH: 29.8 pg (ref 26.0–34.0)
MCHC: 32.1 g/dL (ref 30.0–36.0)
MCV: 92.9 fL (ref 78.0–100.0)
PLATELETS: 132 10*3/uL — AB (ref 150–400)
RBC: 4.36 MIL/uL (ref 3.87–5.11)
RDW: 14.7 % (ref 11.5–15.5)
WBC: 2.8 10*3/uL — AB (ref 4.0–10.5)

## 2016-03-21 LAB — BASIC METABOLIC PANEL
ANION GAP: 10 (ref 5–15)
BUN: 18 mg/dL (ref 6–20)
CO2: 28 mmol/L (ref 22–32)
Calcium: 9.1 mg/dL (ref 8.9–10.3)
Chloride: 104 mmol/L (ref 101–111)
Creatinine, Ser: 1.03 mg/dL — ABNORMAL HIGH (ref 0.44–1.00)
GFR calc Af Amer: >60 mL/min (ref >60)
GFR, EST NON AFRICAN AMERICAN: 54 mL/min — AB (ref >60)
GLUCOSE: 80 mg/dL (ref 65–99)
POTASSIUM: 4 mmol/L (ref 3.5–5.1)
Sodium: 142 mmol/L (ref 135–145)

## 2016-03-21 LAB — PROTIME-INR
INR: 1.02 (ref 0.00–1.49)
Prothrombin Time: 13.6 seconds (ref 11.6–15.2)

## 2016-03-21 LAB — SURGICAL PCR SCREEN
MRSA, PCR: NEGATIVE
STAPHYLOCOCCUS AUREUS: NEGATIVE

## 2016-03-21 SURGERY — PPM/BIV PPM GENERATOR CHANGEOUT
Anesthesia: LOCAL

## 2016-03-21 MED ORDER — ONDANSETRON HCL 4 MG/2ML IJ SOLN: 4.0000 mg | Freq: Four times a day (QID) | INTRAMUSCULAR | Status: DC | PRN

## 2016-03-21 MED ORDER — SODIUM CHLORIDE 0.9 % IR SOLN
80.0000 mg | Status: AC
  Administered 2016-03-21: 80 mg

## 2016-03-21 MED ORDER — FENTANYL CITRATE (PF) 100 MCG/2ML IJ SOLN
INTRAMUSCULAR | Status: DC | PRN
  Administered 2016-03-21: 25 ug via INTRAVENOUS
  Administered 2016-03-21: 50 ug via INTRAVENOUS

## 2016-03-21 MED ORDER — VANCOMYCIN HCL IN DEXTROSE 1-5 GM/200ML-% IV SOLN
1000.0000 mg | INTRAVENOUS | Status: AC
  Administered 2016-03-21: 1000 mg via INTRAVENOUS

## 2016-03-21 MED ORDER — MIDAZOLAM HCL 5 MG/5ML IJ SOLN
INTRAMUSCULAR | Status: AC
  Filled 2016-03-21: qty 5

## 2016-03-21 MED ORDER — VANCOMYCIN HCL IN DEXTROSE 1-5 GM/200ML-% IV SOLN
INTRAVENOUS | Status: AC
  Filled 2016-03-21: qty 200

## 2016-03-21 MED ORDER — LIDOCAINE HCL (PF) 1 % IJ SOLN
INTRAMUSCULAR | Status: AC
  Filled 2016-03-21: qty 30

## 2016-03-21 MED ORDER — MUPIROCIN 2 % EX OINT
1.0000 "application " | TOPICAL_OINTMENT | Freq: Once | CUTANEOUS | Status: AC
  Administered 2016-03-21: 1 via TOPICAL

## 2016-03-21 MED ORDER — CHLORHEXIDINE GLUCONATE 4 % EX LIQD
60.0000 mL | Freq: Once | CUTANEOUS | Status: DC
  Filled 2016-03-21: qty 60

## 2016-03-21 MED ORDER — HEPARIN (PORCINE) IN NACL 2-0.9 UNIT/ML-% IJ SOLN
INTRAMUSCULAR | Status: AC
  Filled 2016-03-21: qty 500

## 2016-03-21 MED ORDER — FENTANYL CITRATE (PF) 100 MCG/2ML IJ SOLN
INTRAMUSCULAR | Status: AC
  Filled 2016-03-21: qty 2

## 2016-03-21 MED ORDER — SODIUM CHLORIDE 0.9 % IR SOLN
Status: AC
  Filled 2016-03-21: qty 2

## 2016-03-21 MED ORDER — MUPIROCIN 2 % EX OINT
TOPICAL_OINTMENT | CUTANEOUS | Status: AC
  Administered 2016-03-21: 1 via TOPICAL
  Filled 2016-03-21: qty 22

## 2016-03-21 MED ORDER — SODIUM CHLORIDE 0.9 % IV SOLN: INTRAVENOUS | Status: AC

## 2016-03-21 MED ORDER — LIDOCAINE HCL (PF) 1 % IJ SOLN
INTRAMUSCULAR | Status: DC | PRN
  Administered 2016-03-21: 38 mL via INTRADERMAL

## 2016-03-21 MED ORDER — MIDAZOLAM HCL 5 MG/5ML IJ SOLN
INTRAMUSCULAR | Status: DC | PRN
  Administered 2016-03-21: 1 mg via INTRAVENOUS
  Administered 2016-03-21: 2 mg via INTRAVENOUS

## 2016-03-21 MED ORDER — SODIUM CHLORIDE 0.9 % IV SOLN
INTRAVENOUS | Status: DC
  Administered 2016-03-21: 11:00:00 via INTRAVENOUS

## 2016-03-21 MED ORDER — ACETAMINOPHEN 325 MG PO TABS
325.0000 mg | ORAL_TABLET | ORAL | Status: DC | PRN
  Filled 2016-03-21: qty 2

## 2016-03-21 SURGICAL SUPPLY — 6 items
CABLE SURGICAL S-101-97-12 (CABLE) ×2 IMPLANT
HEMOSTAT SURGICEL 2X4 FIBR (HEMOSTASIS) ×2 IMPLANT
PAD DEFIB LIFELINK (PAD) ×2 IMPLANT
SYS PACING ACCOLADE EL DR L321 (Pacemaker) ×2 IMPLANT
SYSTEM PACING ACCLD EL DR L321 (Pacemaker) ×1 IMPLANT
TRAY PACEMAKER INSERTION (PACKS) ×2 IMPLANT

## 2016-03-21 NOTE — H&P (View-Only) (Signed)
Patient Care Team: Maryland Pink, MD as PCP - General (Family Medicine)   HPI  Denise [name] is a 70 y.o. female Seen in followup for pacemaker implanted 2004 for syncope. She also has a history of atrial fibrillation but has not had any documented recently    This patients CHA2DS2-VASc Score and unadjusted Ischemic Stroke Rate (% per year) is equal to 2.2 % stroke rate/year from a score of 2--gender/age  Above score calculated as 1 point each if present [CHF, HTN, DM, Vascular=MI/PAD/Aortic Plaque, Age if 65-74, or Female] Above score calculated as 2 points each if present [Age > 75, or Stroke/TIA/TE]  Her device is a generator replacement.  She also notes that over the last few months she has had problems with progressive dyspnea on exertion. It is unassociated with chest discomfort. She has not noted edema or nocturnal dyspnea.  Echocardiogram is from 2006 and was normal.   Past Medical History  Diagnosis Date  . Syncope and collapse   . Depressive disorder, not elsewhere classified   . Heart disease, unspecified   . Arrhythmia     a-fib  . Pacemaker-[employer] 08/14/2012    Past Surgical History  Procedure Laterality Date  . Cardiac catheterization  2004    [place]  . Insert / replace / remove pacemaker  10/21/2003    [place]/ [employer]    Current Outpatient Prescriptions  Medication Sig Dispense Refill  . aspirin 81 MG tablet Take 81 mg by mouth daily.    . clobetasol cream (TEMOVATE) AB-123456789 % Apply 1 application topically as needed.     . diphenhydrAMINE (BENADRYL) 25 MG tablet Take 25 mg by mouth every 6 (six) hours as needed for itching or allergies.    Marland Kitchen EPINEPHrine 0.3 mg/0.3 mL IJ SOAJ injection Inject 0.3 mg into the muscle once. Allergic to red meat.    . mupirocin ointment (BACTROBAN) 2 % Apply 1 application topically as needed.      No current facility-administered medications for this visit.    Allergies  Allergen Reactions  . Meat  Extract Hives and Swelling    Red meat. Makes her throat close and hives breakout.  Marland Kitchen Penicillins     Review of Systems negative except from HPI and PMH  Physical Exam BP 140/90 mmHg  Pulse 60  Ht 5\' 4"  (1.626 m)  Wt 199 lb 12.8 oz (90.629 kg)  BMI 34.28 kg/m2 Well developed and well nourished in no acute distress HENT normal E scleral and icterus clear Neck Supple JVP flat; carotids brisk and full Clear to ausculation  Device pocket well healed; without hematoma or erythema.  There is no tethering *Regular rate and rhythm, no murmurs gallops or rub Soft with active bowel sounds No clubbing cyanosis  Edema Alert and oriented, grossly normal motor and sensory function Skin Warm and Dry; may be some minimal discoloration superior and lateral to her device. There is no warmth or tenderness   ECG  nsr ( prob a pacing)  60 13/08/38  Assessment and  Plan  Sinus node dysfunction  Atrial fibrillation  Pacemaker-[employer]    Dyspnea on exertion  The dyspnea is worse in the context of her obesity we have again discussed sleep apnea and its implications. Given the rapid acceleration of her events, excluding worsening LV function for coronary artery disease becomes important. These might have an implication of device generator replacement strategy. We will undertake a Lexiscan Myoview   We have  reviewed the benefits and risks of generator replacement.  These include but are not limited to lead fracture and infection.  The patient understands, agrees and is willing to proceed.

## 2016-03-21 NOTE — Discharge Instructions (Signed)
°  Pacemaker Battery Change, Care After Refer to this sheet in the next few weeks. These instructions provide you with information on caring for yourself after your procedure. Your health care provider may also give you more specific instructions. Your treatment has been planned according to current medical practices, but problems sometimes occur. Call your health care provider if you have any problems or questions after your procedure. WHAT TO EXPECT AFTER THE PROCEDURE After your procedure, it is typical to have the following sensations:  Soreness at the pacemaker site. HOME CARE INSTRUCTIONS   Keep the incision clean and dry till tomorrow evening.  Dermabond will come off in 10-14 days naturally. If not, MD will remove at follow up appointment.  No driving for 4 days.  For the first week after the replacement, avoid stretching motions that pull at the incision site, and avoid heavy exercise with the arm that is on the same side as the incision.  Take medicines only as directed by your health care provider.  Keep all follow-up visits as directed by your health care provider. SEEK MEDICAL CARE IF:   You have pain at the incision site that is not relieved by over-the-counter or prescription medicine.  There is drainage or pus from the incision site.  There is swelling larger than a lime at the incision site.  You develop red streaking that extends above or below the incision site.  You feel brief, intermittent palpitations, light-headedness, or any symptoms that you feel might be related to your heart. SEEK IMMEDIATE MEDICAL CARE IF:   You experience chest pain that is different than the pain at the pacemaker site.  You experience shortness of breath.  You have palpitations or irregular heartbeat.  You have light-headedness that does not go away quickly.  You faint.  You have pain that gets worse and is not relieved by medicine.   This information is not intended to replace  advice given to you by your health care provider. Make sure you discuss any questions you have with your health care provider.   Document Released: 09/16/2013 Document Revised: 12/17/2014 Document Reviewed: 09/16/2013 Elsevier Interactive Patient Education [employer].

## 2016-03-21 NOTE — Interval H&P Note (Signed)
History and Physical Interval Note:  myoview with normal LV function No ischemia  03/21/2016 11:22 AM  Denise [name]  has presented today for surgery, with the diagnosis of Sinus Node Dysfunction   The various methods of treatment have been discussed with the patient and family. After consideration of risks, benefits and other options for treatment, the patient has consented to  Procedure(s): PPM Generator Changeout (N/A) as a surgical intervention .  The patient's history has been reviewed, patient examined, no change in status, stable for surgery.  I have reviewed the patient's chart and labs.  Questions were answered to the patient's satisfaction.     Virl Axe

## 2016-03-22 MED FILL — Heparin Sodium (Porcine) 2 Unit/ML in Sodium Chloride 0.9%: INTRAMUSCULAR | Qty: 1000 | Status: AC

## 2016-03-28 ENCOUNTER — Encounter: Payer: Self-pay | Admitting: Internal Medicine

## 2016-03-28 ENCOUNTER — Ambulatory Visit (INDEPENDENT_AMBULATORY_CARE_PROVIDER_SITE_OTHER): Payer: Medicare Other | Admitting: *Deleted

## 2016-03-28 DIAGNOSIS — Z950 Presence of cardiac pacemaker: Secondary | ICD-10-CM | POA: Diagnosis not present

## 2016-03-28 LAB — CUP PACEART INCLINIC DEVICE CHECK
Brady Statistic RV Percent Paced: 1 % — CL
Implantable Lead Implant Date: 20041111
Implantable Lead Location: 753859
Implantable Lead Model: 4088
Implantable Lead Serial Number: 420854
Lead Channel Impedance Value: 397 Ohm
Lead Channel Impedance Value: 599 Ohm
Lead Channel Pacing Threshold Amplitude: 1 V
Lead Channel Pacing Threshold Amplitude: 1.2 V
Lead Channel Pacing Threshold Pulse Width: 0.4 ms
Lead Channel Pacing Threshold Pulse Width: 0.4 ms
Lead Channel Setting Pacing Amplitude: 2 V
MDC IDC LEAD IMPLANT DT: 20041111
MDC IDC LEAD LOCATION: 753860
MDC IDC LEAD SERIAL: 209644
MDC IDC MSMT LEADCHNL RA SENSING INTR AMPL: 3.7 mV
MDC IDC MSMT LEADCHNL RV SENSING INTR AMPL: 20.6 mV
MDC IDC SESS DTM: 20170419040000
MDC IDC SET LEADCHNL RV PACING AMPLITUDE: 2.5 V
MDC IDC SET LEADCHNL RV PACING PULSEWIDTH: 0.4 ms
MDC IDC SET LEADCHNL RV SENSING SENSITIVITY: 2.5 mV
MDC IDC STAT BRADY RA PERCENT PACED: 77 %
Pulse Gen Serial Number: 718133

## 2016-03-28 NOTE — Progress Notes (Signed)
Wound check appointment. Dermabond partially removed. Wound without redness or edema. Incision edges approximated, wound well healed. Normal device function. Thresholds, sensing, and impedances consistent with implant measurements. Device programmed at chronic levels d/t gen change. Histogram distribution blunted in comparison to previous device histogram. Minute ventilation response factor increased to 10, ventialtory thresholds adjusted to patients demographics. VT detection rate lowered to 140 d/t pt reported "fast heart rates". No mode switches or high ventricular rates noted. Patient educated about wound care, arm mobility, lifting restrictions. ROV in 3 months with implanting physician.

## 2016-03-29 ENCOUNTER — Telehealth: Payer: Self-pay | Admitting: *Deleted

## 2016-03-29 ENCOUNTER — Ambulatory Visit: Payer: Medicare Other

## 2016-03-29 NOTE — Telephone Encounter (Signed)
Called patient with recommendations from Dr. Caryl Comes regarding intermittent left arm aching.  Advised patient that Dr. Caryl Comes does not feel that the intermittent aching is related to her generator change as her leads were not manipulated.  Advised patient to follow-up with her PCP if the arm pain continues and she verbalizes understanding.  Also advised patient that Dr. Caryl Comes recommended that we will continue to monitor her heart rates now that her [facility] detection has been reduced to 140bpm.  Patient is agreeable to this plan.  She is aware to call if she develops new or worsening symptoms, including chest pain, dizziness, or SOB.  Patient is appreciative of call and denies additional questions or concerns at this time.

## 2016-06-20 ENCOUNTER — Encounter: Payer: Self-pay | Admitting: Internal Medicine

## 2016-06-20 ENCOUNTER — Ambulatory Visit (INDEPENDENT_AMBULATORY_CARE_PROVIDER_SITE_OTHER): Payer: Medicare Other | Admitting: Internal Medicine

## 2016-06-20 VITALS — BP 118/88 | HR 60 | Ht 64.0 in | Wt 197.4 lb

## 2016-06-20 DIAGNOSIS — I495 Sick sinus syndrome: Secondary | ICD-10-CM | POA: Diagnosis not present

## 2016-06-20 DIAGNOSIS — Z950 Presence of cardiac pacemaker: Secondary | ICD-10-CM

## 2016-06-20 DIAGNOSIS — I4891 Unspecified atrial fibrillation: Secondary | ICD-10-CM | POA: Diagnosis not present

## 2016-06-20 LAB — CUP PACEART INCLINIC DEVICE CHECK
Date Time Interrogation Session: 20170712040000
Implantable Lead Implant Date: 20041111
Implantable Lead Implant Date: 20041111
Implantable Lead Location: 753859
Implantable Lead Location: 753860
Implantable Lead Model: 4088
Implantable Lead Model: 4473
Implantable Lead Serial Number: 209644
Implantable Lead Serial Number: 420854
Lead Channel Pacing Threshold Amplitude: 1 V
Lead Channel Pacing Threshold Amplitude: 1.2 V
Lead Channel Pacing Threshold Pulse Width: 0.4 ms
Lead Channel Pacing Threshold Pulse Width: 0.4 ms
Lead Channel Sensing Intrinsic Amplitude: 17.1 mV
Lead Channel Sensing Intrinsic Amplitude: 4.2 mV
Lead Channel Setting Pacing Amplitude: 2 V
Lead Channel Setting Pacing Amplitude: 2.5 V
Lead Channel Setting Pacing Pulse Width: 0.4 ms
Lead Channel Setting Sensing Sensitivity: 2.5 mV
Pulse Gen Serial Number: 718133

## 2016-06-20 NOTE — Progress Notes (Signed)
Patient Care Team: Maryland Pink, MD as PCP - General (Family Medicine)   HPI  Denise [name] is a 70 y.o. female Seen in followup for pacemaker implanted 2004 for syncope. She also has a history of atrial fibrillation but has not had any documented recently    She underwent generator  Replacement 4/17   This patients CHA2DS2-VASc Score and unadjusted Ischemic Stroke Rate (% per year) is equal to 2.2 % stroke rate/year from a score of 2--gender/age  Above score calculated as 1 point each if present [CHF, HTN, DM, Vascular=MI/PAD/Aortic Plaque, Age if 65-74, or Female] Above score calculated as 2 points each if present [Age > 75, or Stroke/TIA/TE]    She also notes that over the last few months she has had problems with progressive dyspnea on exertion. It is unassociated with chest discomfort. She has not noted edema or nocturnal dyspnea.  DATE TEST     2006 echo LV normal    3/17    myoview   EF 55-65 %      Echocardiogram is from 2006 and was normal.  Past Medical History  Diagnosis Date  . Syncope and collapse   . Depressive disorder, not elsewhere classified   . Heart disease, unspecified   . Arrhythmia     a-fib  . Pacemaker-[employer] 08/14/2012    Past Surgical History  Procedure Laterality Date  . Cardiac catheterization  2004    [place]  . Insert / replace / remove pacemaker  10/21/2003    [place]/ [employer]  . Ep implantable device N/A 03/21/2016    Procedure: PPM Generator Changeout;  Surgeon: Deboraha Sprang, MD;  Location: [facility];  Service: Cardiovascular;  Laterality: N/A;    Current Outpatient Prescriptions  Medication Sig Dispense Refill  . aspirin 81 MG tablet Take 81 mg by mouth daily.    . clobetasol cream (TEMOVATE) [id number] % Apply 1 application topically as needed (for rash).     . diphenhydrAMINE (BENADRYL) 25 MG tablet Take 25 mg by mouth every 6 (six) hours as needed for itching or allergies.    Marland Kitchen EPINEPHrine 0.3  mg/0.3 mL IJ SOAJ injection Inject 0.3 mg into the muscle once. Allergic to red meat.    Marland Kitchen ibuprofen (ADVIL,MOTRIN) 100 MG tablet Take 100 mg by mouth every 8 (eight) hours as needed for pain or fever.    . mupirocin ointment (BACTROBAN) 2 % Apply 1 application topically as needed (for skin).      No current facility-administered medications for this visit.    Allergies  Allergen Reactions  . Meat Extract Anaphylaxis, Hives, Swelling and Other (See Comments)    Red meat. Makes her throat close and hives breakout.  Marland Kitchen Penicillins Hives    Review of Systems negative except from HPI and PMH  Physical Exam BP 118/88 mmHg  Pulse 60  Ht 5\' 4"  (1.626 m)  Wt 197 lb 6.4 oz (89.54 kg)  BMI 33.87 kg/m2  SpO2 97% Well developed and well nourished in no acute distress HENT normal E scleral and icterus clear Neck Supple JVP flat; carotids brisk and full Clear to ausculation  Device pocket well healed; without hematoma or erythema.  There is no tethering *Regular rate and rhythm, no murmurs gallops or rub Soft with active bowel sounds No clubbing cyanosis  Edema Alert and oriented, grossly normal motor and sensory function Skin Warm and Dry;    ECG  A pacing 60 14/07/38  Assessment and  Plan  Sinus node dysfunction  Atrial fibrillation  Pacemaker-[employer]    Daytime somnolence     There has been no interval atrial fibrillation. We will continue to monitor for this prior to initiation of anticoagulation.  She has had nonsustained atrial tachycardia. I've advised her to follow up with her PCP to consider a sleep study

## 2016-06-20 NOTE — Patient Instructions (Signed)
Medication Instructions: Your physician recommends that you continue on your current medications as directed. Please refer to the Current Medication list given to you today. Labwork: none  Procedures/Testing: none  Follow-Up:  Your physician wants you to follow-up in 9 months with Dr. Gari Crown will receive a reminder letter in the mail two months in advance. If you don't receive a letter, please call our office to schedule the follow-up appointment.  Remote monitoring is used to monitor your Pacemaker of ICD from home. This monitoring reduces the number of office visits required to check your device to one time per year. It allows [place] to keep an eye on the functioning of your device to ensure it is working properly. You are scheduled for a device check from home on 09/19/16. You may send your transmission at any time that day. If you have a wireless device, the transmission will be sent automatically. After your physician reviews your transmission, you will receive a postcard with your next transmission date.  Any Additional Special Instructions Will Be Listed Below (If Applicable).     If you need a refill on your cardiac medications before your next appointment, please call your pharmacy.

## 2016-06-26 ENCOUNTER — Other Ambulatory Visit: Payer: Self-pay | Admitting: Unknown Physician Specialty

## 2016-06-26 DIAGNOSIS — M952 Other acquired deformity of head: Secondary | ICD-10-CM

## 2016-07-06 ENCOUNTER — Ambulatory Visit
Admission: RE | Admit: 2016-07-06 | Discharge: 2016-07-06 | Disposition: A | Discharge status: Home / Self Care | Payer: Medicare Other | Source: Ambulatory Visit | Attending: Unknown Physician Specialty | Admitting: Unknown Physician Specialty

## 2016-07-06 DIAGNOSIS — M952 Other acquired deformity of head: Secondary | ICD-10-CM | POA: Insufficient documentation

## 2016-07-06 DIAGNOSIS — J3489 Other specified disorders of nose and nasal sinuses: Secondary | ICD-10-CM | POA: Insufficient documentation

## 2016-09-19 ENCOUNTER — Ambulatory Visit (INDEPENDENT_AMBULATORY_CARE_PROVIDER_SITE_OTHER): Payer: Medicare Other | Admitting: *Deleted

## 2016-09-19 DIAGNOSIS — I495 Sick sinus syndrome: Secondary | ICD-10-CM

## 2016-09-19 NOTE — Progress Notes (Signed)
Remote pacemaker transmission.   

## 2016-09-20 ENCOUNTER — Encounter: Payer: Self-pay | Admitting: Cardiology

## 2016-10-04 ENCOUNTER — Encounter: Payer: Self-pay | Admitting: Cardiology

## 2016-10-19 LAB — CUP PACEART REMOTE DEVICE CHECK
Battery Remaining Longevity: 180 mo
Battery Remaining Percentage: 100 %
Implantable Lead Implant Date: 20041111
Implantable Lead Location: 753860
Implantable Lead Model: 4088
Implantable Lead Model: 4473
Implantable Lead Serial Number: 420854
Implantable Pulse Generator Implant Date: 20170412
Lead Channel Pacing Threshold Pulse Width: 0.4 ms
Lead Channel Setting Pacing Amplitude: 2 V
Lead Channel Setting Pacing Amplitude: 2.5 V
Lead Channel Setting Pacing Pulse Width: 0.4 ms
Lead Channel Setting Sensing Sensitivity: 2.5 mV
MDC IDC LEAD IMPLANT DT: 20041111
MDC IDC LEAD LOCATION: 753859
MDC IDC LEAD SERIAL: 209644
MDC IDC MSMT LEADCHNL RA IMPEDANCE VALUE: 411 Ohm
MDC IDC MSMT LEADCHNL RA PACING THRESHOLD AMPLITUDE: 1.1 V
MDC IDC MSMT LEADCHNL RA PACING THRESHOLD PULSEWIDTH: 0.4 ms
MDC IDC MSMT LEADCHNL RV IMPEDANCE VALUE: 606 Ohm
MDC IDC MSMT LEADCHNL RV PACING THRESHOLD AMPLITUDE: 1.3 V
MDC IDC PG SERIAL: 718133
MDC IDC SESS DTM: 20171011085100
MDC IDC STAT BRADY RA PERCENT PACED: 47 %
MDC IDC STAT BRADY RV PERCENT PACED: 3 %

## 2016-11-20 ENCOUNTER — Other Ambulatory Visit: Payer: Self-pay | Admitting: Family Medicine

## 2016-12-19 ENCOUNTER — Telehealth: Payer: Self-pay | Admitting: Cardiology

## 2016-12-19 ENCOUNTER — Ambulatory Visit (INDEPENDENT_AMBULATORY_CARE_PROVIDER_SITE_OTHER): Payer: Medicare Other | Admitting: *Deleted

## 2016-12-19 DIAGNOSIS — I495 Sick sinus syndrome: Secondary | ICD-10-CM

## 2016-12-19 NOTE — Progress Notes (Signed)
Remote pacemaker transmission.   

## 2016-12-19 NOTE — Telephone Encounter (Signed)
Spoke with pt and reminded pt of remote transmission that is due today. Pt verbalized understanding.   

## 2016-12-20 ENCOUNTER — Encounter: Payer: Self-pay | Admitting: Cardiology

## 2017-01-03 ENCOUNTER — Encounter: Payer: Self-pay | Admitting: Cardiology

## 2017-01-03 LAB — CUP PACEART REMOTE DEVICE CHECK
Brady Statistic RA Percent Paced: 48 %
Implantable Lead Implant Date: 20041111
Implantable Lead Serial Number: 209644
Lead Channel Impedance Value: 597 Ohm
Lead Channel Pacing Threshold Pulse Width: 0.4 ms
Lead Channel Setting Pacing Amplitude: 2.5 V
Lead Channel Setting Sensing Sensitivity: 2.5 mV
MDC IDC LEAD IMPLANT DT: 20041111
MDC IDC LEAD LOCATION: 753859
MDC IDC LEAD LOCATION: 753860
MDC IDC LEAD SERIAL: 420854
MDC IDC MSMT BATTERY REMAINING LONGEVITY: 180 mo
MDC IDC MSMT BATTERY REMAINING PERCENTAGE: 100 %
MDC IDC MSMT LEADCHNL RA IMPEDANCE VALUE: 404 Ohm
MDC IDC MSMT LEADCHNL RA PACING THRESHOLD AMPLITUDE: 1.1 V
MDC IDC MSMT LEADCHNL RV PACING THRESHOLD AMPLITUDE: 1.4 V
MDC IDC MSMT LEADCHNL RV PACING THRESHOLD PULSEWIDTH: 0.4 ms
MDC IDC PG IMPLANT DT: 20170412
MDC IDC SESS DTM: 20180110095000
MDC IDC SET LEADCHNL RA PACING AMPLITUDE: 2 V
MDC IDC SET LEADCHNL RV PACING PULSEWIDTH: 0.4 ms
MDC IDC STAT BRADY RV PERCENT PACED: 3 %
Pulse Gen Serial Number: 718133

## 2017-01-21 ENCOUNTER — Telehealth: Payer: Self-pay | Admitting: Internal Medicine

## 2017-01-21 ENCOUNTER — Telehealth: Payer: Self-pay | Admitting: Cardiology

## 2017-01-21 NOTE — Telephone Encounter (Signed)
Pt remote transmission was received. Informed pt that a device tech RN will call her back and her appt for today was cancelled. Pt agreeded to this plan. Please refer to other phone note for details.

## 2017-01-21 NOTE — Telephone Encounter (Signed)
Transmission reviewed. No episodes during the last month.  Denise [name] made aware- I have asked her to go to the monitor and send a transmission the next time she feels her heart race/flutter so that I can review "current EGM". I will call back if any abnormality on next transmission.  She also asks if intermittent leg soreness can indicate a blood clot- I advised her that usually the pain is constant and she would feel heat at the location of clot. She verbalizes understanding.

## 2017-01-21 NOTE — Telephone Encounter (Signed)
New message   Pt calling this morning wanting to schedule a walk-in pacer check.

## 2017-01-21 NOTE — Telephone Encounter (Signed)
Spoke w/ pt and she informed me that her heart racing off and on for a couple weeks. Pt stated that she is not short of breath, no dizziness, no palpitations, no chest pain. Instructed pt to send remote transmission. Pt verbalized understanding. Transmission was unsuccessful. Pt agreed to an appt today at 2 PM w/ the device clinic.

## 2017-03-21 ENCOUNTER — Ambulatory Visit (INDEPENDENT_AMBULATORY_CARE_PROVIDER_SITE_OTHER): Payer: Medicare Other | Admitting: Internal Medicine

## 2017-03-21 ENCOUNTER — Encounter: Payer: Self-pay | Admitting: Internal Medicine

## 2017-03-21 VITALS — BP 118/76 | HR 66 | Ht 64.0 in | Wt 205.0 lb

## 2017-03-21 DIAGNOSIS — I4821 Permanent atrial fibrillation: Secondary | ICD-10-CM

## 2017-03-21 DIAGNOSIS — I482 Chronic atrial fibrillation: Secondary | ICD-10-CM

## 2017-03-21 DIAGNOSIS — Z950 Presence of cardiac pacemaker: Secondary | ICD-10-CM

## 2017-03-21 DIAGNOSIS — I495 Sick sinus syndrome: Secondary | ICD-10-CM

## 2017-03-21 NOTE — Patient Instructions (Signed)
Medication Instructions:  Your physician recommends that you continue on your current medications as directed. Please refer to the Current Medication list given to you today.   Labwork: None ordered  Testing/Procedures: None ordered  Follow-Up: Remote monitoring is used to monitor your Pacemaker from home. This monitoring reduces the number of office visits required to check your device to one time per year. It allows [place] to keep an eye on the functioning of your device to ensure it is working properly. You are scheduled for a device check from home on 06/20/17. You may send your transmission at any time that day. If you have a wireless device, the transmission will be sent automatically. After your physician reviews your transmission, you will receive a postcard with your next transmission date.    Your physician wants you to follow-up in: 1 year with Dr. Caryl Comes. You will receive a reminder letter in the mail two months in advance. If you don't receive a letter, please call our office to schedule the follow-up appointment.   Any Other Special Instructions Will Be Listed Below (If Applicable).     If you need a refill on your cardiac medications before your next appointment, please call your pharmacy.

## 2017-03-21 NOTE — Progress Notes (Signed)
Patient Care Team: [place] Pink, MD as PCP - General (Family Medicine)   HPI  Denise [name] is a 71 y.o. female Seen in followup for pacemaker implanted 2004 for syncope. She also has a history of atrial fibrillation but has not had any documented recently    She underwent generator  Replacement 4/17   This patients CHA2DS2-VASc Score and unadjusted Ischemic Stroke Rate (% per year) is equal to 2.2 % stroke rate/year from a score of 2--gender/age  Above score calculated as 1 point each if present [CHF, HTN, DM, Vascular=MI/PAD/Aortic Plaque, Age if 65-74, or Female] Above score calculated as 2 points each if present [Age > 75, or Stroke/TIA/TE]    She also notes that over the last few months she has had problems with progressive dyspnea on exertion. It is unassociated with chest discomfort. She has not noted edema or nocturnal dyspnea. This continues to be dizzy. She notes that her weight has gone up. She is up almost 10 pounds in the last year. She exercises little.  DATE TEST     2006 echo LV normal    3/17    myoview   EF 55-65 %      Echocardiogram is from 2006 and was normal.  Past Medical History:  Diagnosis Date  . Arrhythmia    a-fib  . Depressive disorder, not elsewhere classified   . Heart disease, unspecified   . [employer] 08/14/2012  . Syncope and collapse     Past Surgical History:  Procedure Laterality Date  . CARDIAC CATHETERIZATION  2004   [place]  . EP IMPLANTABLE DEVICE N/A 03/21/2016   Procedure: PPM Generator Changeout;  Surgeon: Deboraha Sprang, MD;  Location: [facility];  Service: Cardiovascular;  Laterality: N/A;  . INSERT / REPLACE / REMOVE PACEMAKER  10/21/2003   [place]/ [employer]    Current Outpatient Prescriptions  Medication Sig Dispense Refill  . aspirin 81 MG tablet Take 81 mg by mouth daily.    . clobetasol cream (TEMOVATE) 7.20 % Apply 1 application topically as needed (for rash).     .  diphenhydrAMINE (BENADRYL) 25 MG tablet Take 25 mg by mouth every 6 (six) hours as needed for itching or allergies.    Marland Kitchen EPINEPHrine 0.3 mg/0.3 mL IJ SOAJ injection Inject 0.3 mg into the muscle once. Allergic to red meat.    Marland Kitchen ibuprofen (ADVIL,MOTRIN) 100 MG tablet Take 100 mg by mouth every 8 (eight) hours as needed for pain or fever.    . mupirocin ointment (BACTROBAN) 2 % Apply 1 application topically as needed (for skin).      No current facility-administered medications for this visit.     Allergies  Allergen Reactions  . Meat Extract Anaphylaxis, Hives, Swelling and Other (See Comments)    Red meat. Makes her throat close and hives breakout.  Marland Kitchen Penicillins Hives    Review of Systems negative except from HPI and PMH  Physical Exam BP 118/76   Pulse 66   Ht 5\' 4"  (1.626 m)   Wt 205 lb (93 kg)   SpO2 96%   BMI 35.19 kg/m  Well developed and well nourished in no acute distress HENT normal E scleral and icterus clear Neck Supple JVP flat; carotids brisk and full Clear to ausculation  Device pocket well healed; without hematoma or erythema.  There is no tethering *Regular rate and rhythm, no murmurs gallops or rub Soft with active bowel sounds No clubbing  cyanosis  Edema Alert and oriented, grossly normal motor and sensory function Skin Warm and Dry;    ECG  A pacing 65 16/07/38  Assessment and  Plan  Sinus node dysfunction  Atrial fibrillation  Pacemaker-[employer]    Daytime somnolence      There has been no interval atrial fibrillation. We will continue to monitor for this prior to initiation of anticoagulation.  She has had nonsustained atrial tachycardia.  She will followup with her PCP \ Also about diet and exercise

## 2017-04-23 LAB — CUP PACEART INCLINIC DEVICE CHECK
Implantable Lead Implant Date: 20041111
Implantable Lead Location: 753859
Implantable Lead Location: 753860
Implantable Lead Model: 4088
Implantable Lead Serial Number: 209644
Implantable Lead Serial Number: 420854
Implantable Pulse Generator Implant Date: 20170412
Lead Channel Impedance Value: 458 Ohm
Lead Channel Impedance Value: 654 Ohm
Lead Channel Sensing Intrinsic Amplitude: 4.4 mV
Lead Channel Setting Pacing Amplitude: 2 V
Lead Channel Setting Pacing Amplitude: 2.5 V
Lead Channel Setting Pacing Pulse Width: 0.4 ms
MDC IDC LEAD IMPLANT DT: 20041111
MDC IDC MSMT LEADCHNL RV SENSING INTR AMPL: 25 mV — AB
MDC IDC PG SERIAL: 718133
MDC IDC SESS DTM: 20180412040000
MDC IDC SET LEADCHNL RV SENSING SENSITIVITY: 2.5 mV

## 2017-06-20 ENCOUNTER — Encounter: Payer: Medicare Other | Admitting: *Deleted

## 2017-06-20 ENCOUNTER — Telehealth: Payer: Self-pay | Admitting: Cardiology

## 2017-06-20 NOTE — Telephone Encounter (Signed)
Spoke with pt and reminded pt of remote transmission that is due today. Pt verbalized understanding.   

## 2017-06-25 ENCOUNTER — Encounter: Payer: Self-pay | Admitting: Cardiology

## 2017-07-04 ENCOUNTER — Ambulatory Visit (INDEPENDENT_AMBULATORY_CARE_PROVIDER_SITE_OTHER): Payer: Medicare Other | Admitting: *Deleted

## 2017-07-04 DIAGNOSIS — I495 Sick sinus syndrome: Secondary | ICD-10-CM

## 2017-07-05 ENCOUNTER — Encounter: Payer: Self-pay | Admitting: Cardiology

## 2017-07-05 NOTE — Progress Notes (Signed)
Remote pacemaker transmission.   

## 2017-07-12 ENCOUNTER — Other Ambulatory Visit: Payer: Self-pay | Admitting: Family Medicine

## 2017-07-12 DIAGNOSIS — Z1231 Encounter for screening mammogram for malignant neoplasm of breast: Secondary | ICD-10-CM

## 2017-07-25 ENCOUNTER — Ambulatory Visit: Payer: Medicare Other | Attending: Neurology

## 2017-07-25 DIAGNOSIS — G4733 Obstructive sleep apnea (adult) (pediatric): Secondary | ICD-10-CM | POA: Diagnosis present

## 2017-07-30 ENCOUNTER — Ambulatory Visit
Admission: RE | Admit: 2017-07-30 | Discharge: 2017-07-30 | Disposition: A | Discharge status: Home / Self Care | Payer: Medicare Other | Source: Ambulatory Visit | Attending: Family Medicine | Admitting: Family Medicine

## 2017-07-30 DIAGNOSIS — Z1231 Encounter for screening mammogram for malignant neoplasm of breast: Secondary | ICD-10-CM | POA: Diagnosis present

## 2017-08-05 ENCOUNTER — Encounter: Payer: Self-pay | Admitting: Cardiology

## 2017-08-07 ENCOUNTER — Ambulatory Visit: Payer: Medicare Other | Attending: Neurology

## 2017-08-07 DIAGNOSIS — G4733 Obstructive sleep apnea (adult) (pediatric): Secondary | ICD-10-CM | POA: Diagnosis present

## 2017-08-08 LAB — CUP PACEART REMOTE DEVICE CHECK
Implantable Lead Implant Date: 20041111
Implantable Lead Location: 753859
Implantable Lead Serial Number: 420854
Implantable Pulse Generator Implant Date: 20170412
Lead Channel Setting Pacing Amplitude: 2 V
Lead Channel Setting Pacing Pulse Width: 0.4 ms
Lead Channel Setting Sensing Sensitivity: 2.5 mV
MDC IDC LEAD IMPLANT DT: 20041111
MDC IDC LEAD LOCATION: 753860
MDC IDC LEAD SERIAL: 209644
MDC IDC SESS DTM: 20180830134817
MDC IDC SET LEADCHNL RV PACING AMPLITUDE: 2.5 V
Pulse Gen Serial Number: 718133

## 2017-10-03 ENCOUNTER — Ambulatory Visit (INDEPENDENT_AMBULATORY_CARE_PROVIDER_SITE_OTHER): Payer: Medicare Other | Admitting: *Deleted

## 2017-10-03 DIAGNOSIS — I495 Sick sinus syndrome: Secondary | ICD-10-CM

## 2017-10-03 NOTE — Progress Notes (Signed)
Remote pacemaker transmission.   

## 2017-10-04 ENCOUNTER — Encounter: Payer: Self-pay | Admitting: Cardiology

## 2017-10-04 LAB — CUP PACEART REMOTE DEVICE CHECK
Battery Remaining Percentage: 100 %
Date Time Interrogation Session: 20181025124700
Implantable Lead Implant Date: 20041111
Implantable Lead Location: 753859
Implantable Lead Model: 4088
Implantable Lead Serial Number: 420854
Implantable Pulse Generator Implant Date: 20170412
Lead Channel Impedance Value: 623 Ohm
Lead Channel Pacing Threshold Amplitude: 1.1 V
Lead Channel Pacing Threshold Amplitude: 1.3 V
Lead Channel Pacing Threshold Pulse Width: 0.4 ms
Lead Channel Pacing Threshold Pulse Width: 0.4 ms
Lead Channel Setting Pacing Amplitude: 2 V
Lead Channel Setting Pacing Amplitude: 2.5 V
Lead Channel Setting Pacing Pulse Width: 0.4 ms
Lead Channel Setting Sensing Sensitivity: 2.5 mV
MDC IDC LEAD IMPLANT DT: 20041111
MDC IDC LEAD LOCATION: 753860
MDC IDC LEAD SERIAL: 209644
MDC IDC MSMT BATTERY REMAINING LONGEVITY: 168 mo
MDC IDC MSMT LEADCHNL RA IMPEDANCE VALUE: 436 Ohm
MDC IDC STAT BRADY RA PERCENT PACED: 57 %
MDC IDC STAT BRADY RV PERCENT PACED: 4 %
Pulse Gen Serial Number: 718133

## 2017-11-15 ENCOUNTER — Telehealth: Payer: Self-pay

## 2017-11-15 NOTE — Telephone Encounter (Signed)
   [facility] Pre-operative Risk Assessment    Request for surgical clearance:  1. What type of surgery is being performed? Colonoscopy    2. When is this surgery scheduled? 11/18/17   3. Are there any medications that need to be held prior to surgery and how long? N/A   4. Practice name and name of physician performing surgery? [facility]    5. What is your office phone and fax number? (P) [phone] (F) [phone]   6. Anesthesia type (None, local, MAC, general) ? Monitored Anesthesia    Denise [name] 11/15/2017, 11:20 AM  _________________________________________________________________   (provider comments below)

## 2017-11-18 NOTE — Telephone Encounter (Signed)
   Primary Cardiologist:Steven Caryl Comes, MD  Chart reviewed as part of pre-operative protocol coverage. Because of Denise [name]'s past medical history and time since last visit, he/she will require a follow-up visit in order to better assess preoperative cardiovascular risk. Last OV 03/21/17 at which time she was describing increased shortness of breath and dizziness, with recommendation to f/u PCP. No recent labs for review in 2018.  Pre-op covering staff: - Please schedule appointment and call patient to inform them. - Please contact requesting surgeon's office via preferred method (i.e, phone, fax) to inform them of need for appointment prior to surgery.  Charlie Pitter, PA-C  11/18/2017, 10:02 AM

## 2017-11-20 NOTE — Telephone Encounter (Signed)
Please call and schedule pt with Dr Caryl Comes for pre op clearance

## 2017-11-22 ENCOUNTER — Telehealth: Payer: Self-pay | Admitting: *Deleted

## 2017-11-22 NOTE — Telephone Encounter (Signed)
Spoke with pt re: cardiac clearance and that she needed an appt before we could clear her. Pt has been scheduled to see Donnajean Lopes, 12/13/17. Pt thanked me for the call.

## 2017-11-22 NOTE — Telephone Encounter (Signed)
   [facility] Pre-operative Risk Assessment    Request for surgical clearance:  1. What type of surgery is being performed? COLONOSCOPY  2. When is this surgery scheduled? 01/20/18 @ [place]   3. Are there any medications that need to be held prior to surgery and how long? ASA 81 MG HELD THE MORNING OF PROCEDURE.  4. Practice name and name of physician performing surgery? [place], PA-C  5. What is your office phone and fax number?  6. PHONE # [phone]; FAX # [phone]  7. Anesthesia type (None, local, MAC, general) ?    Julaine Hua 11/22/2017, 2:49 PM  _________________________________________________________________   (provider comments below)

## 2017-11-22 NOTE — Telephone Encounter (Signed)
This message was routed to [place] so that they know that we have scheduled an appointment for preprocedure cardiac eval.

## 2017-11-22 NOTE — Telephone Encounter (Signed)
   Primary Cardiologist:Steven Caryl Comes, MD  Chart reviewed as part of pre-operative protocol coverage. Because of Denise [name]'s past medical history and time since last visit, he/she will require a follow-up visit in order to better assess preoperative cardiovascular risk.  She is scheduled to see Vick Frees, PA on 12/13/17 for preop evaluation.   Daune Perch, NP  11/22/2017, 3:14 PM

## 2017-12-13 ENCOUNTER — Ambulatory Visit (INDEPENDENT_AMBULATORY_CARE_PROVIDER_SITE_OTHER): Payer: Medicare Other | Admitting: Physician Assistant

## 2017-12-13 VITALS — BP 128/76 | HR 62 | Ht 64.0 in | Wt 206.0 lb

## 2017-12-13 DIAGNOSIS — I480 Paroxysmal atrial fibrillation: Secondary | ICD-10-CM

## 2017-12-13 DIAGNOSIS — Z01818 Encounter for other preprocedural examination: Secondary | ICD-10-CM | POA: Diagnosis not present

## 2017-12-13 DIAGNOSIS — Z950 Presence of cardiac pacemaker: Secondary | ICD-10-CM

## 2017-12-13 NOTE — Patient Instructions (Addendum)
Medication Instructions:   Your physician recommends that you continue on your current medications as directed. Please refer to the Current Medication list given to you today.   If you need a refill on your cardiac medications before your next appointment, please call your pharmacy.  Labwork: NONE ORDERED  TODAY    Testing/Procedures: NONE ORDERED  TODAY    Follow-Up:  Your physician wants you to follow-up in: [place] will receive a reminder letter in the mail two months in advance. If you don't receive a letter, please call our office to schedule the follow-up appointment.     Remote monitoring is used to monitor your Pacemaker of ICD from home. This monitoring reduces the number of office visits required to check your device to one time per year. It allows [place] to keep an eye on the functioning of your device to ensure it is working properly. You are scheduled for a device check from home on . 01-02-18 You may send your transmission at any time that day. If you have a wireless device, the transmission will be sent automatically. After your physician reviews your transmission, you will receive a postcard with your next transmission date.     Any Other Special Instructions Will Be Listed Below (If Applicable).

## 2017-12-13 NOTE — Progress Notes (Signed)
Cardiology Office Note Date:  12/13/2017  Patient ID:  Collin, Hendley Sep 26, 1946, MRN [id number] PCP:  [place] Pink, MD  Cardiologist:  Dr. Caryl Comes   Chief Complaint: pre-op  History of Present Illness: Denise [name] is a 72 y.o. female with history of PPM 2/2 syncope, and paroxysmal AFib.  She comes today to be seen for Dr. Caryl Comes, last seen by him in April.  His notes reviewed, DOE appears to be a complaint dating back to March 2017, at that time referred for stress testing, was negative for ischemia noting normal LVEF. He also mentions her hx of AF though none documented recently with plans to continue to monitor without initiationg a/c.  The patient is pending a colonoscopy 2/2 to colicky type bowel issues and increasing food intolerances.  She is feeling very well.  She reports her exertional capacity unchanged for years.  She since her last visit has been found with OSA and now using CPAP every night.  She does no formal exercise [place].  She likes to tend her chicken particularly and routinely caries 50lb feed bags to the coop and tends/cleans etc.  She denies any kind of CP, palpitations, no SOB, no symptoms of PND or orthopnea.  No dizziness, near syncope or syncope.  When asked specifically about DOE previously mentioned, this dated back years, is not a persistent or ongoing problem.   RCRI score is 0.4 there is no available recent Creatinine, though even if it is >2, risk score remains low at 0.9  Patient reports routine labs with her PMD, never any mention of renal problems. DUKE (DASI): 8.97 METS  Device information: BSCi dual chamber PPM, s/p gen change 03/21/16, initial implant 2004   Past Medical History:  Diagnosis Date  . Arrhythmia    a-fib  . Depressive disorder, not elsewhere classified   . Heart disease, unspecified   . [employer] 08/14/2012  . Syncope and collapse     Past Surgical History:    Procedure Laterality Date  . CARDIAC CATHETERIZATION  2004   [place]  . EP IMPLANTABLE DEVICE N/A 03/21/2016   Procedure: PPM Generator Changeout;  Surgeon: Deboraha Sprang, MD;  Location: [facility];  Service: Cardiovascular;  Laterality: N/A;  . INSERT / REPLACE / REMOVE PACEMAKER  10/21/2003   [place]/ [employer]    Current Outpatient Medications  Medication Sig Dispense Refill  . aspirin 81 MG tablet Take 81 mg by mouth daily.    . clobetasol cream (TEMOVATE) 2.50 % Apply 1 application topically as needed (for rash).     . diphenhydrAMINE (BENADRYL) 25 MG tablet Take 25 mg by mouth every 6 (six) hours as needed for itching or allergies.    Marland Kitchen EPINEPHrine 0.3 mg/0.3 mL IJ SOAJ injection Inject 0.3 mg into the muscle once. Allergic to red meat.    Marland Kitchen ibuprofen (ADVIL,MOTRIN) 100 MG tablet Take 100 mg by mouth every 8 (eight) hours as needed for pain or fever.    . mupirocin ointment (BACTROBAN) 2 % Apply 1 application topically as needed (for skin).      No current facility-administered medications for this visit.     Allergies:   Meat extract and Penicillins   Social History:  The patient  reports that  has never smoked. she has never used smokeless tobacco. She reports that she does not drink alcohol or use drugs.   Family History:  The patient's family history  includes Hypertension in her mother, sister, and sister.  ROS:  Please see the history of present illness.  All other systems are reviewed and otherwise negative.   PHYSICAL EXAM:  VS:  BP 128/76   Pulse 62   Ht 5\' 4"  (1.626 m)   Wt 206 lb (93.4 kg)   BMI 35.36 kg/m  BMI: Body mass index is 35.36 kg/m. Well nourished, well developed, in no acute distress  HEENT: normocephalic, atraumatic  Neck: no JVD, carotid bruits or masses Cardiac:  RRR; no significant murmurs, no rubs, or gallops Lungs:  CTA b/l, no wheezing, rhonchi or rales  Abd: soft, non-tender, obese MS: no deformity or atrophy Ext: no edema   Skin: warm and dry, no rash Neuro:  No gross deficits appreciated Psych: euthymic mood, full affect  PPM site is stable, no tethering or discomfort   EKG:  Done today and reviewed by myself is A paced, V sensed, appears unchanged PPM interrogation done today and reviewed by myself:   Battery and lead measurements are good,   02/27/16: lexiscan stress myoview  There was no ST segment deviation noted during stress.  No T wave inversion was noted during stress.  The study is normal.  This is a low risk study.  The left ventricular ejection fraction is normal (55-65%). Myocardial perfusion is normal. The study is normal. This is a low risk study. Overall left ventricular systolic function was normal. LV cavity size is normal. The left ventricular ejection fraction is normal (55-65%).   Recent Labs: No results found for requested labs within last 8760 hours.  No results found for requested labs within last 8760 hours.   CrCl cannot be calculated (Patient's most recent lab result is older than the maximum 21 days allowed.).   Wt Readings from Last 3 Encounters:  12/13/17 206 lb (93.4 kg)  03/21/17 205 lb (93 kg)  06/20/16 197 lb 6.4 oz (89.5 kg)     Other studies reviewed: Additional studies/records reviewed today include: summarized above  ASSESSMENT AND PLAN:  1. PPM     Intact function, no changes made  2. Paroxysmal AFib     Unclear when this occurred     Dr. Caryl Comes has discussed no interval AFib has been noted, not on a/c     2 episodes, both 3 seconds of an ATach noted   3. Pre-procedure/colonoscopy     Based on ACC/AHA guidelines,  would be at acceptable risk for the planned procedure without further cardiovascular testing.     Disposition: F/u with every 3 month remotes and 1 year in-clinic EP visit, sooner if needed.  Current medicines are reviewed at length with the patient today.  The patient did not have any concerns regarding medicines.  Venetia Night, PA-C 12/13/2017 1:18 PM     [address] [place] [address] [phone] (office)  [phone] (fax)

## 2018-01-02 ENCOUNTER — Ambulatory Visit (INDEPENDENT_AMBULATORY_CARE_PROVIDER_SITE_OTHER): Payer: Medicare Other | Admitting: *Deleted

## 2018-01-02 DIAGNOSIS — I495 Sick sinus syndrome: Secondary | ICD-10-CM | POA: Diagnosis not present

## 2018-01-02 NOTE — Progress Notes (Signed)
Remote pacemaker transmission.   

## 2018-01-03 ENCOUNTER — Encounter: Payer: Self-pay | Admitting: Cardiology

## 2018-01-22 LAB — CUP PACEART REMOTE DEVICE CHECK
Implantable Lead Implant Date: 20041111
Implantable Lead Location: 753859
Implantable Lead Model: 4088
Implantable Lead Serial Number: 209644
MDC IDC LEAD IMPLANT DT: 20041111
MDC IDC LEAD LOCATION: 753860
MDC IDC LEAD SERIAL: 420854
MDC IDC PG IMPLANT DT: 20170412
MDC IDC PG SERIAL: 718133
MDC IDC SESS DTM: 20190213124009
MDC IDC SET LEADCHNL RA PACING AMPLITUDE: 2 V
MDC IDC SET LEADCHNL RV PACING AMPLITUDE: 2.5 V
MDC IDC SET LEADCHNL RV PACING PULSEWIDTH: 0.4 ms
MDC IDC SET LEADCHNL RV SENSING SENSITIVITY: 2.5 mV

## 2018-04-01 ENCOUNTER — Encounter: Payer: Self-pay | Admitting: *Deleted

## 2018-04-02 ENCOUNTER — Ambulatory Visit: Payer: Medicare Other | Admitting: Anesthesiology

## 2018-04-02 ENCOUNTER — Encounter
Admission: RE | Disposition: A | Discharge status: Home / Self Care | Payer: Self-pay | Source: Ambulatory Visit | Attending: Unknown Physician Specialty

## 2018-04-02 ENCOUNTER — Ambulatory Visit
Admission: RE | Admit: 2018-04-02 | Discharge: 2018-04-02 | Disposition: A | Discharge status: Home / Self Care | Payer: Medicare Other | Source: Ambulatory Visit | Attending: Unknown Physician Specialty | Admitting: Unknown Physician Specialty

## 2018-04-02 ENCOUNTER — Encounter: Payer: Self-pay | Admitting: *Deleted

## 2018-04-02 DIAGNOSIS — Z1211 Encounter for screening for malignant neoplasm of colon: Secondary | ICD-10-CM | POA: Insufficient documentation

## 2018-04-02 DIAGNOSIS — Z7982 Long term (current) use of aspirin: Secondary | ICD-10-CM | POA: Diagnosis not present

## 2018-04-02 DIAGNOSIS — D124 Benign neoplasm of descending colon: Secondary | ICD-10-CM | POA: Diagnosis not present

## 2018-04-02 DIAGNOSIS — Z8371 Family history of colonic polyps: Secondary | ICD-10-CM | POA: Insufficient documentation

## 2018-04-02 DIAGNOSIS — G4730 Sleep apnea, unspecified: Secondary | ICD-10-CM | POA: Diagnosis not present

## 2018-04-02 DIAGNOSIS — K5730 Diverticulosis of large intestine without perforation or abscess without bleeding: Secondary | ICD-10-CM | POA: Diagnosis not present

## 2018-04-02 DIAGNOSIS — Z950 Presence of cardiac pacemaker: Secondary | ICD-10-CM | POA: Diagnosis not present

## 2018-04-02 DIAGNOSIS — K640 First degree hemorrhoids: Secondary | ICD-10-CM | POA: Diagnosis not present

## 2018-04-02 HISTORY — DX: Urticaria, unspecified: L50.9

## 2018-04-02 HISTORY — DX: Paroxysmal atrial fibrillation: I48.0

## 2018-04-02 HISTORY — DX: Anxiety disorder, unspecified: F41.9

## 2018-04-02 HISTORY — DX: Sick sinus syndrome: I49.5

## 2018-04-02 HISTORY — DX: Sleep apnea, unspecified: G47.30

## 2018-04-02 HISTORY — PX: COLONOSCOPY WITH PROPOFOL: SHX5780

## 2018-04-02 SURGERY — COLONOSCOPY WITH PROPOFOL
Anesthesia: General

## 2018-04-02 MED ORDER — PROPOFOL 500 MG/50ML IV EMUL
INTRAVENOUS | Status: DC | PRN
  Administered 2018-04-02: 150 ug/kg/min via INTRAVENOUS

## 2018-04-02 MED ORDER — PROPOFOL 10 MG/ML IV BOLUS
INTRAVENOUS | Status: DC | PRN
  Administered 2018-04-02: 100 mg via INTRAVENOUS

## 2018-04-02 MED ORDER — LIDOCAINE 2% (20 MG/ML) 5 ML SYRINGE
INTRAMUSCULAR | Status: DC | PRN
  Administered 2018-04-02: 30 mg via INTRAVENOUS

## 2018-04-02 MED ORDER — PHENYLEPHRINE HCL 10 MG/ML IJ SOLN
INTRAMUSCULAR | Status: DC | PRN
  Administered 2018-04-02 (×2): 100 ug via INTRAVENOUS

## 2018-04-02 MED ORDER — FENTANYL CITRATE (PF) 100 MCG/2ML IJ SOLN
INTRAMUSCULAR | Status: AC
  Filled 2018-04-02: qty 2

## 2018-04-02 MED ORDER — SODIUM CHLORIDE 0.9 % IV SOLN: INTRAVENOUS | Status: DC

## 2018-04-02 MED ORDER — GLYCOPYRROLATE 0.2 MG/ML IJ SOLN
INTRAMUSCULAR | Status: AC
  Filled 2018-04-02: qty 1

## 2018-04-02 MED ORDER — LIDOCAINE HCL (PF) 2 % IJ SOLN
INTRAMUSCULAR | Status: AC
  Filled 2018-04-02: qty 10

## 2018-04-02 MED ORDER — SODIUM CHLORIDE 0.9 % IV SOLN
INTRAVENOUS | Status: DC
  Administered 2018-04-02: 11:00:00 via INTRAVENOUS

## 2018-04-02 MED ORDER — FENTANYL CITRATE (PF) 100 MCG/2ML IJ SOLN
INTRAMUSCULAR | Status: DC | PRN
  Administered 2018-04-02 (×2): 50 ug via INTRAVENOUS

## 2018-04-02 NOTE — Transfer of Care (Signed)
Immediate Anesthesia Transfer of Care Note  Patient: Denise [name]  Procedure(s) Performed: COLONOSCOPY WITH PROPOFOL (N/A )  Patient Location: PACU and Endoscopy Unit  Anesthesia Type:General  Level of Consciousness: sedated  Airway & Oxygen Therapy: Patient Spontanous Breathing and Patient connected to nasal cannula oxygen  Post-op Assessment: Report given to RN and Post -op Vital signs reviewed and stable  Post vital signs: Reviewed and stable  Last Vitals:  Vitals Value Taken Time  BP    Temp 36.1 C 04/02/2018 11:30 AM  Pulse 60 04/02/2018 11:30 AM  Resp    SpO2 99 % 04/02/2018 11:30 AM  Vitals shown include unvalidated device data.  Last Pain:  Vitals:   04/02/18 1130  TempSrc: Tympanic  PainSc:          Complications: No apparent anesthesia complications

## 2018-04-02 NOTE — H&P (Signed)
Primary Care Physician:  [place] Pink, MD Primary Gastroenterologist:  Dr. Vira Agar  Pre-Procedure History & Physical: HPI:  Denise [name] is a 72 y.o. female is here for an colonoscopy.  Done for FH colon polyps.   Past Medical History:  Diagnosis Date  . Anxiety   . Arrhythmia    a-fib  . Depressive disorder, not elsewhere classified   . Heart disease, unspecified   . Hives   . [employer] 08/14/2012  . Paroxysmal A-fib ([place])   . Sinoatrial node dysfunction (HCC)   . Sleep apnea   . Syncope and collapse     Past Surgical History:  Procedure Laterality Date  . CARDIAC CATHETERIZATION  2004   [place]  . EP IMPLANTABLE DEVICE N/A 03/21/2016   Procedure: PPM Generator Changeout;  Surgeon: Deboraha Sprang, MD;  Location: [facility];  Service: Cardiovascular;  Laterality: N/A;  . INSERT / REPLACE / REMOVE PACEMAKER  10/21/2003   [place]/ [employer]    Prior to Admission medications   Medication Sig Start Date End Date Taking? Authorizing Provider  triamcinolone (NASACORT) 55 MCG/ACT AERO nasal inhaler Place 2 sprays into the nose daily.   Yes [provider]  aspirin 81 MG tablet Take 81 mg by mouth daily.    [provider]  clobetasol cream (TEMOVATE) 6.83 % Apply 1 application topically as needed (for rash).  06/08/14   [provider]  diphenhydrAMINE (BENADRYL) 25 MG tablet Take 25 mg by mouth every 6 (six) hours as needed for itching or allergies.    [provider]  EPINEPHrine 0.3 mg/0.3 mL IJ SOAJ injection Inject 0.3 mg into the muscle once. Allergic to red meat.    [provider]  ibuprofen (ADVIL,MOTRIN) 100 MG tablet Take 100 mg by mouth every 8 (eight) hours as needed for pain or fever.    [provider]  mupirocin ointment (BACTROBAN) 2 % Apply 1 application topically as needed (for skin).  06/08/14   [provider]    Allergies as of 09/10/2017 - Review Complete 03/21/2017   Allergen Reaction Noted  . Meat extract Anaphylaxis, Hives, Swelling, and Other (See Comments) 02/23/2016  . Penicillins Hives 08/06/2012    Family History  Problem Relation Age of Onset  . Hypertension Mother   . Hypertension Sister   . Hypertension Sister   . Breast cancer Neg Hx     Social History   Socioeconomic History  . Marital status: Married    Spouse name: Not on file  . Number of children: Not on file  . Years of education: Not on file  . Highest education level: Not on file  Occupational History  . Not on file  Social Needs  . Financial resource strain: Not on file  . Food insecurity:    Worry: Not on file    Inability: Not on file  . Transportation needs:    Medical: Not on file    Non-medical: Not on file  Tobacco Use  . Smoking status: Never Smoker  . Smokeless tobacco: Never Used  Substance and Sexual Activity  . Alcohol use: No  . Drug use: No  . Sexual activity: Not on file  Lifestyle  . Physical activity:    Days per week: Not on file    Minutes per session: Not on file  . Stress: Not on file  Relationships  . Social connections:    Talks on phone: Not on file    Gets  together: Not on file    Attends religious service: Not on file    Active member of club or organization: Not on file    Attends meetings of clubs or organizations: Not on file    Relationship status: Not on file  . Intimate partner violence:    Fear of current or ex partner: Not on file    Emotionally abused: Not on file    Physically abused: Not on file    Forced sexual activity: Not on file  Other Topics Concern  . Not on file  Social History Narrative  . Not on file    Review of Systems: See HPI, otherwise negative ROS  Physical Exam: BP 97/65   Pulse (!) 59   Temp (!) 97 F (36.1 C)   Resp 15   Ht 5\' 4"  (1.626 m)   Wt 86.6 kg (191 lb)   SpO2 99%   BMI 32.79 kg/m  General:   Alert,  pleasant and cooperative in NAD Head:  Normocephalic and  atraumatic. Neck:  Supple; no masses or thyromegaly. Lungs:  Clear throughout to auscultation.    Heart:  Regular rate and rhythm. Abdomen:  Soft, nontender and nondistended. Normal bowel sounds, without guarding, and without rebound.   Neurologic:  Alert and  oriented x4;  grossly normal neurologically.  Impression/Plan: Denise [name] is here for an colonoscopy to be performed for FH colon polyps.  Risks, benefits, limitations, and alternatives regarding  colonoscopy have been reviewed with the patient.  Questions have been answered.  All parties agreeable.   Gaylyn Cheers, MD  04/02/2018, 11:35 AM

## 2018-04-02 NOTE — Anesthesia Preprocedure Evaluation (Signed)
Anesthesia Evaluation  Patient identified by MRN, date of birth, ID band Patient awake    Reviewed: Allergy & Precautions, NPO status , Patient's Chart, lab work & pertinent test results  History of Anesthesia Complications Negative for: history of anesthetic complications  Airway Mallampati: III  TM Distance: >3 FB Neck ROM: Full    Dental no notable dental hx.    Pulmonary sleep apnea , neg COPD,    breath sounds clear to auscultation- rhonchi (-) wheezing      Cardiovascular Exercise Tolerance: Good (-) hypertension(-) angina(-) CAD, (-) Past MI and (-) Cardiac Stents + dysrhythmias Atrial Fibrillation + pacemaker  Rhythm:Regular Rate:Normal - Systolic murmurs and - Diastolic murmurs    Neuro/Psych PSYCHIATRIC DISORDERS Anxiety Depression negative neurological ROS     GI/Hepatic negative GI ROS, Neg liver ROS,   Endo/Other  negative endocrine ROSneg diabetes  Renal/GU negative Renal ROS     Musculoskeletal negative musculoskeletal ROS (+)   Abdominal (+) + obese,   Peds  Hematology negative hematology ROS (+)   Anesthesia Other Findings Past Medical History: No date: Anxiety No date: Arrhythmia     Comment:  a-fib No date: Depressive disorder, not elsewhere classified No date: Heart disease, unspecified No date: Hives 08/14/2012: Pacemaker-[employer] No date: Paroxysmal A-fib ([place]) No date: Sinoatrial node dysfunction (HCC) No date: Sleep apnea No date: Syncope and collapse   Reproductive/Obstetrics                             Anesthesia Physical Anesthesia Plan  ASA: II  Anesthesia Plan: General   Post-op Pain Management:    Induction: Intravenous  PONV Risk Score and Plan: 2 and Propofol infusion  Airway Management Planned: Natural Airway  Additional Equipment:   Intra-op Plan:   Post-operative Plan:   Informed Consent: I have reviewed the patients  History and Physical, chart, labs and discussed the procedure including the risks, benefits and alternatives for the proposed anesthesia with the patient or authorized representative who has indicated his/her understanding and acceptance.   Dental advisory given  Plan Discussed with: CRNA and Anesthesiologist  Anesthesia Plan Comments:         Anesthesia Quick Evaluation

## 2018-04-02 NOTE — Anesthesia Post-op Follow-up Note (Signed)
Anesthesia QCDR form completed.        

## 2018-04-02 NOTE — Op Note (Signed)
[facility] Patient Name: Denise [name] Procedure Date: 04/02/2018 10:48 AM MRN: [id number] Account #: [id number] Date of Birth: [birth year] Admit Type: Outpatient Age: 72 Room: [facility] ENDO ROOM 4 Gender: Female Note Status: Finalized Procedure:            Colonoscopy Indications:          Family history of colonic polyps in a first-degree                        relative Providers:            Manya Silvas, MD Referring MD:         Irven Easterly. Kary Kos, MD (Referring MD) Medicines:            Propofol per Anesthesia Complications:        No immediate complications. Procedure:            Pre-Anesthesia Assessment:                       - After reviewing the risks and benefits, the patient                        was deemed in satisfactory condition to undergo the                        procedure.                       After obtaining informed consent, the colonoscope was                        passed under direct vision. Throughout the procedure,                        the patient's blood pressure, pulse, and oxygen                        saturations were monitored continuously. The                        Colonoscope was introduced through the anus and                        advanced to the the cecum, identified by appendiceal                        orifice and ileocecal valve. The colonoscopy was                        somewhat difficult due to significant looping and a                        tortuous colon. Successful completion of the procedure                        was aided by applying abdominal pressure. The patient                        tolerated the procedure well. The quality of the bowel  preparation was excellent. Findings:      A diminutive polyp was found in the distal descending colon. The polyp       was sessile. The polyp was removed with a jumbo cold forceps. Resection       and retrieval were complete.  Multiple small-mouthed diverticula and a few large ones were found in       the sigmoid colon and descending colon. The sigmoid and descending colon       both had sharp turns.      Internal hemorrhoids were found during endoscopy. The hemorrhoids were       small and Grade I (internal hemorrhoids that do not prolapse). Impression:           - One diminutive polyp in the distal descending colon,                        removed with a jumbo cold forceps. Resected and                        retrieved.                       - Diverticulosis in the sigmoid colon and in the                        descending colon.                       - Internal hemorrhoids. Recommendation:       - Await pathology results. Manya Silvas, MD 04/02/2018 11:31:05 AM This report has been signed electronically. Number of Addenda: 0 Note Initiated On: 04/02/2018 10:48 AM Scope Withdrawal Time: 0 hours 11 minutes 43 seconds  Total Procedure Duration: 0 hours 24 minutes 32 seconds       [facility]

## 2018-04-03 ENCOUNTER — Ambulatory Visit (INDEPENDENT_AMBULATORY_CARE_PROVIDER_SITE_OTHER): Payer: Medicare Other | Admitting: *Deleted

## 2018-04-03 DIAGNOSIS — I495 Sick sinus syndrome: Secondary | ICD-10-CM

## 2018-04-03 LAB — SURGICAL PATHOLOGY

## 2018-04-03 NOTE — Anesthesia Postprocedure Evaluation (Signed)
Anesthesia Post Note  Patient: Denise [name]  Procedure(s) Performed: COLONOSCOPY WITH PROPOFOL (N/A )  Patient location during evaluation: Endoscopy Anesthesia Type: General Level of consciousness: awake and alert and oriented Pain management: pain level controlled Vital Signs Assessment: post-procedure vital signs reviewed and stable Respiratory status: spontaneous breathing, nonlabored ventilation and respiratory function stable Cardiovascular status: blood pressure returned to baseline and stable Postop Assessment: no signs of nausea or vomiting Anesthetic complications: no     Last Vitals:  Vitals:   04/02/18 1132 04/02/18 1150  BP:  104/89  Pulse: (!) 59   Resp: 15   Temp: (!) 36.1 C   SpO2: 99%     Last Pain:  Vitals:   04/03/18 0754  TempSrc:   PainSc: 0-No pain                 Sayer Masini

## 2018-04-04 ENCOUNTER — Encounter: Payer: Self-pay | Admitting: Cardiology

## 2018-04-04 NOTE — Progress Notes (Signed)
Remote pacemaker transmission.   

## 2018-04-25 LAB — CUP PACEART REMOTE DEVICE CHECK
Battery Remaining Percentage: 100 %
Date Time Interrogation Session: 20190425085100
Implantable Lead Implant Date: 20041111
Implantable Lead Serial Number: 209644
Implantable Lead Serial Number: 420854
Implantable Pulse Generator Implant Date: 20170412
Lead Channel Pacing Threshold Pulse Width: 0.4 ms
Lead Channel Pacing Threshold Pulse Width: 0.4 ms
Lead Channel Setting Pacing Pulse Width: 0.4 ms
Lead Channel Setting Sensing Sensitivity: 2.5 mV
MDC IDC LEAD IMPLANT DT: 20041111
MDC IDC LEAD LOCATION: 753859
MDC IDC LEAD LOCATION: 753860
MDC IDC MSMT BATTERY REMAINING LONGEVITY: 156 mo
MDC IDC MSMT LEADCHNL RA IMPEDANCE VALUE: 442 Ohm
MDC IDC MSMT LEADCHNL RA PACING THRESHOLD AMPLITUDE: 1 V
MDC IDC MSMT LEADCHNL RV IMPEDANCE VALUE: 636 Ohm
MDC IDC MSMT LEADCHNL RV PACING THRESHOLD AMPLITUDE: 1.4 V
MDC IDC SET LEADCHNL RA PACING AMPLITUDE: 2 V
MDC IDC SET LEADCHNL RV PACING AMPLITUDE: 2.5 V
MDC IDC STAT BRADY RA PERCENT PACED: 72 %
MDC IDC STAT BRADY RV PERCENT PACED: 0 %
Pulse Gen Serial Number: 718133

## 2018-07-03 ENCOUNTER — Ambulatory Visit (INDEPENDENT_AMBULATORY_CARE_PROVIDER_SITE_OTHER): Payer: Medicare Other | Admitting: *Deleted

## 2018-07-03 DIAGNOSIS — I495 Sick sinus syndrome: Secondary | ICD-10-CM | POA: Diagnosis not present

## 2018-07-03 DIAGNOSIS — I442 Atrioventricular block, complete: Secondary | ICD-10-CM

## 2018-07-03 NOTE — Progress Notes (Signed)
Remote pacemaker transmission.   

## 2018-07-25 LAB — CUP PACEART REMOTE DEVICE CHECK
Date Time Interrogation Session: 20190816141655
Implantable Lead Implant Date: 20041111
Implantable Lead Location: 753859
Implantable Lead Location: 753860
Implantable Lead Serial Number: 420854
MDC IDC LEAD IMPLANT DT: 20041111
MDC IDC LEAD SERIAL: 209644
MDC IDC PG IMPLANT DT: 20170412
Pulse Gen Serial Number: 718133

## 2018-09-22 ENCOUNTER — Emergency Department
Admission: EM | Admit: 2018-09-22 | Discharge: 2018-09-22 | Disposition: A | Discharge status: Home / Self Care | Payer: Medicare Other | Attending: Emergency Medicine | Admitting: Emergency Medicine

## 2018-09-22 ENCOUNTER — Other Ambulatory Visit: Payer: Self-pay

## 2018-09-22 ENCOUNTER — Encounter: Payer: Self-pay | Admitting: Emergency Medicine

## 2018-09-22 DIAGNOSIS — L509 Urticaria, unspecified: Secondary | ICD-10-CM | POA: Insufficient documentation

## 2018-09-22 DIAGNOSIS — Z950 Presence of cardiac pacemaker: Secondary | ICD-10-CM | POA: Insufficient documentation

## 2018-09-22 DIAGNOSIS — Z7982 Long term (current) use of aspirin: Secondary | ICD-10-CM | POA: Insufficient documentation

## 2018-09-22 DIAGNOSIS — T7840XA Allergy, unspecified, initial encounter: Secondary | ICD-10-CM | POA: Diagnosis not present

## 2018-09-22 MED ORDER — METHYLPREDNISOLONE SODIUM SUCC 125 MG IJ SOLR
125.0000 mg | Freq: Once | INTRAMUSCULAR | Status: AC
  Administered 2018-09-22: 125 mg via INTRAVENOUS

## 2018-09-22 MED ORDER — METHYLPREDNISOLONE SODIUM SUCC 125 MG IJ SOLR
INTRAMUSCULAR | Status: AC
  Filled 2018-09-22: qty 2

## 2018-09-22 MED ORDER — FAMOTIDINE IN NACL 20-0.9 MG/50ML-% IV SOLN
20.0000 mg | Freq: Once | INTRAVENOUS | Status: AC
  Administered 2018-09-22: 20 mg via INTRAVENOUS

## 2018-09-22 MED ORDER — DIPHENHYDRAMINE HCL 25 MG PO TABS: 25.0000 mg | ORAL_TABLET | Freq: Four times a day (QID) | ORAL | 0 refills | Status: DC

## 2018-09-22 MED ORDER — FAMOTIDINE IN NACL 20-0.9 MG/50ML-% IV SOLN
INTRAVENOUS | Status: AC
  Administered 2018-09-22: 20 mg via INTRAVENOUS
  Filled 2018-09-22: qty 50

## 2018-09-22 MED ORDER — PREDNISONE 20 MG PO TABS: 40.0000 mg | ORAL_TABLET | Freq: Every day | ORAL | 0 refills | Status: AC

## 2018-09-22 NOTE — ED Notes (Signed)
Pt states itching has stopped, skin does not appear as reddened. welp like areas still present

## 2018-09-22 NOTE — ED Provider Notes (Addendum)
[facility] Emergency Department Provider Note  ____________________________________________  Time seen: Approximately 4:53 PM  I have reviewed the triage vital signs and the nursing notes.   HISTORY  Chief Complaint Allergic Reaction    HPI Denise [name] is a 72 y.o. female with a history of anxiety, atrial fibrillation, pacemaker insertion, and previous allergic reactions and need allergy who reports that about 3:00 PM she was bitten by fire ants in multiple places over her body.  She believes that she was bitten on her arms, bilateral feet and ankles, and abdomen which is caused hives all over her body.  No shortness of breath or vomiting.  No dizziness.  The rash is very itchy.  Constant, no aggravating or alleviating factors.      Past Medical History:  Diagnosis Date  . Anxiety   . Arrhythmia    a-fib  . Depressive disorder, not elsewhere classified   . Heart disease, unspecified   . Hives   . [employer] 08/14/2012  . Paroxysmal A-fib ([place])   . Sinoatrial node dysfunction (HCC)   . Sleep apnea   . Syncope and collapse      Patient Active Problem List   Diagnosis Date Noted  . [employer] 08/14/2012  . Atrial fibrillation ([place]) 08/14/2012  . Sinus node dysfunction ([place]) 08/14/2012  . Anaphylaxis/allergic reaction 08/14/2012     Past Surgical History:  Procedure Laterality Date  . CARDIAC CATHETERIZATION  2004   [place]  . COLONOSCOPY WITH PROPOFOL N/A 04/02/2018   Procedure: COLONOSCOPY WITH PROPOFOL;  Surgeon: Manya Silvas, MD;  Location: [facility] ENDOSCOPY;  Service: Endoscopy;  Laterality: N/A;  . EP IMPLANTABLE DEVICE N/A 03/21/2016   Procedure: PPM Generator Changeout;  Surgeon: Deboraha Sprang, MD;  Location: [place] CV LAB;  Service: Cardiovascular;  Laterality: N/A;  . INSERT / REPLACE / REMOVE PACEMAKER  10/21/2003   [place]/ [employer]     Prior to Admission medications   Medication  Sig Start Date End Date Taking? Authorizing Provider  aspirin 81 MG tablet Take 81 mg by mouth daily.    [provider]  clobetasol cream (TEMOVATE) 7.42 % Apply 1 application topically as needed (for rash).  06/08/14   [provider]  diphenhydrAMINE (BENADRYL) 25 MG tablet Take 25 mg by mouth every 6 (six) hours as needed for itching or allergies.    [provider]  EPINEPHrine 0.3 mg/0.3 mL IJ SOAJ injection Inject 0.3 mg into the muscle once. Allergic to red meat.    [provider]  ibuprofen (ADVIL,MOTRIN) 100 MG tablet Take 100 mg by mouth every 8 (eight) hours as needed for pain or fever.    [provider]  mupirocin ointment (BACTROBAN) 2 % Apply 1 application topically as needed (for skin).  06/08/14   [provider]  triamcinolone (NASACORT) 55 MCG/ACT AERO nasal inhaler Place 2 sprays into the nose daily.    [provider]     Allergies Meat extract and Penicillins   Family History  Problem Relation Age of Onset  . Hypertension Mother   . Hypertension Sister   . Hypertension Sister   . Breast cancer Neg Hx     Social History Social History   Tobacco Use  . Smoking status: Never Smoker  . Smokeless tobacco: Never Used  Substance Use Topics  . Alcohol use: No  . Drug use: No    Review of Systems  Constitutional:   No fever or chills.  ENT:   No sore throat. No rhinorrhea. Cardiovascular:   No chest pain or syncope. Respiratory:   No dyspnea or cough. Gastrointestinal:   Negative for abdominal pain, vomiting and diarrhea.  Musculoskeletal:   Negative for focal pain or swelling All other systems reviewed and are negative except as documented above in ROS and HPI.  ____________________________________________   PHYSICAL EXAM:  VITAL SIGNS: ED Triage Vitals  Enc Vitals Group     BP 09/22/18 1604 (!) 163/81     Pulse Rate 09/22/18 1606 94     Resp 09/22/18 1607 20     Temp 09/22/18 1607 98.6  F (37 C)     Temp Source 09/22/18 1607 Oral     SpO2 09/22/18 1606 100 %     Weight 09/22/18 1608 190 lb 14.7 oz (86.6 kg)     Height 09/22/18 1608 5\' 4"  (1.626 m)     Head Circumference --      Peak Flow --      Pain Score --      Pain Loc --      Pain Edu? --      Excl. in [place]? --     Vital signs reviewed, nursing assessments reviewed.   Constitutional:   Alert and oriented. Non-toxic appearance. Eyes:   Conjunctivae are normal. EOMI. PERRL. ENT      Head:   Normocephalic and atraumatic.      Nose:   No congestion/rhinnorhea.       Mouth/Throat:   MMM, no pharyngeal erythema. No peritonsillar mass.  No tongue swelling or intraoral lesions.  Floor mouth soft      Neck:   No meningismus. Full ROM. Hematological/Lymphatic/Immunilogical:   No cervical lymphadenopathy. Cardiovascular:   RRR. Symmetric bilateral radial and DP pulses.  No murmurs. Cap refill less than 2 seconds. Respiratory:   Normal respiratory effort without tachypnea/retractions. Breath sounds are clear and equal bilaterally. No wheezes/rales/rhonchi. Gastrointestinal:   Soft and nontender. Non distended. There is no CVA tenderness.  No rebound, rigidity, or guarding. Musculoskeletal:   Normal range of motion in all extremities. No joint effusions.  No lower extremity tenderness.  No edema. Neurologic:   Normal speech and language.  Motor grossly intact. No acute focal neurologic deficits are appreciated.  Skin:    Skin is warm, dry and intact.  Diffuse urticarial rash over the anterior chest abdomen, bilateral extremities, and left ankle and foot. ____________________________________________    LABS (pertinent positives/negatives) (all labs ordered are listed, but only abnormal results are displayed) Labs Reviewed - No data to display ____________________________________________   EKG    ____________________________________________    RADIOLOGY  No results  found.  ____________________________________________   PROCEDURES Procedures  ____________________________________________   CLINICAL IMPRESSION / ASSESSMENT AND PLAN / ED COURSE  Pertinent labs & imaging results that were available during my care of the patient were reviewed by me and considered in my medical decision making (see chart for details).    Patient presents after fire ant bites.  Presentation consistent with an allergic reaction.  The urticarial rash is diffuse enough that I doubt she sustained that many fire ant bites but for this all to be a localized reaction and more likely she is having a systemic allergic reaction although her syndrome is not currently consistent with anaphylaxis.  Patient took 50 mg of Benadryl prior to arrival in the ED.  I have given her Solu-Medrol and IV Pepcid as well for further symptom relief.  We  will continue to observe her in the ED for improvement at which point she would be stable for discharge home on steroids and Benadryl if she is feeling better.  ----------------------------------------- 4:58 PM on 09/22/2018 -----------------------------------------  Symptomatically improved, stable for discharge.   ----------------------------------------- 5:55 PM on 09/22/2018 -----------------------------------------  Feels much better, wants to go home.  Symptoms improved, clear to auscultation bilaterally.     ____________________________________________   FINAL CLINICAL IMPRESSION(S) / ED DIAGNOSES    Final diagnoses:  Allergic reaction, initial encounter  Urticaria     ED Discharge Orders    None      Portions of this note were generated with dragon dictation software. Dictation errors may occur despite best attempts at proofreading.    Carrie Mew, MD 09/22/18 1658    Carrie Mew, MD 09/22/18 1755

## 2018-09-22 NOTE — ED Triage Notes (Signed)
Pt was bit by fire ants at 1500, she was bit in her arms, legs and abd. She has hives all over her body. She denies any SOB.She did take Benadryl at 1515 with no relief. Pt states that she is itching

## 2018-09-22 NOTE — ED Notes (Signed)
Pt alert and oriented X4, active, cooperative, pt in NAD. RR even and unlabored, color WNL.  Pt informed to return if any life threatening symptoms occur.  Discharge and followup instructions reviewed. Ambulates safely. 

## 2018-10-02 ENCOUNTER — Ambulatory Visit (INDEPENDENT_AMBULATORY_CARE_PROVIDER_SITE_OTHER): Payer: Medicare Other | Admitting: *Deleted

## 2018-10-02 DIAGNOSIS — I442 Atrioventricular block, complete: Secondary | ICD-10-CM | POA: Diagnosis not present

## 2018-10-02 NOTE — Progress Notes (Signed)
Remote pacemaker transmission.   

## 2018-12-07 LAB — CUP PACEART REMOTE DEVICE CHECK
Date Time Interrogation Session: 20191229184340
Implantable Lead Implant Date: 20041111
Implantable Lead Location: 753859
Implantable Lead Model: 4088
Implantable Lead Model: 4473
Implantable Lead Serial Number: 420854
MDC IDC LEAD IMPLANT DT: 20041111
MDC IDC LEAD LOCATION: 753860
MDC IDC LEAD SERIAL: 209644
MDC IDC PG IMPLANT DT: 20170412
MDC IDC PG SERIAL: 718133

## 2018-12-16 ENCOUNTER — Encounter: Payer: Self-pay | Admitting: Internal Medicine

## 2018-12-16 ENCOUNTER — Ambulatory Visit (INDEPENDENT_AMBULATORY_CARE_PROVIDER_SITE_OTHER): Payer: Medicare Other | Admitting: Internal Medicine

## 2018-12-16 VITALS — BP 130/82 | HR 60 | Ht 64.0 in | Wt 200.0 lb

## 2018-12-16 DIAGNOSIS — I480 Paroxysmal atrial fibrillation: Secondary | ICD-10-CM

## 2018-12-16 DIAGNOSIS — I442 Atrioventricular block, complete: Secondary | ICD-10-CM | POA: Diagnosis not present

## 2018-12-16 DIAGNOSIS — Z950 Presence of cardiac pacemaker: Secondary | ICD-10-CM | POA: Diagnosis not present

## 2018-12-16 DIAGNOSIS — I495 Sick sinus syndrome: Secondary | ICD-10-CM

## 2018-12-16 NOTE — Progress Notes (Signed)
Patient Care Team: [place] Pink, MD as PCP - General (Family Medicine) Deboraha Sprang, MD as PCP - Cardiology (Cardiology)   HPI  Denise [name] is a 73 y.o. female Seen in followup for pacemaker implanted 2004 for syncope. She also has a remote history of atrial fibrillation but has not had any documented recently.  Underwent generator replacement 4/17   This patients CHA2DS2-VASc Score and unadjusted Ischemic Stroke Rate (% per year) is equal to 2.2 % stroke rate/year from a score of 2--gender/age  Above score calculated as 1 point each if present [CHF, HTN, DM, Vascular=MI/PAD/Aortic Plaque, Age if 65-74, or Female] Above score calculated as 2 points each if present [Age > 75, or Stroke/TIA/TE]      DATE TEST EF    2006 echo LV normal    3/17    myoview    55-65 %    The patient denies chest pain, shortness of breath, nocturnal dyspnea, orthopnea or peripheral edema.  There have been no palpitations, lightheadedness or syncope.      Past Medical History:  Diagnosis Date  . Anxiety   . Arrhythmia    a-fib  . Depressive disorder, not elsewhere classified   . Heart disease, unspecified   . Hives   . [employer] 08/14/2012  . Paroxysmal A-fib ([place])   . Sinoatrial node dysfunction (HCC)   . Sleep apnea   . Syncope and collapse     Past Surgical History:  Procedure Laterality Date  . CARDIAC CATHETERIZATION  2004   [place]  . COLONOSCOPY WITH PROPOFOL N/A 04/02/2018   Procedure: COLONOSCOPY WITH PROPOFOL;  Surgeon: Manya Silvas, MD;  Location: [facility] ENDOSCOPY;  Service: Endoscopy;  Laterality: N/A;  . EP IMPLANTABLE DEVICE N/A 03/21/2016   Procedure: PPM Generator Changeout;  Surgeon: Deboraha Sprang, MD;  Location: [place] CV LAB;  Service: Cardiovascular;  Laterality: N/A;  . INSERT / REPLACE / REMOVE PACEMAKER  10/21/2003   [place]/ [employer]    Current Outpatient Medications  Medication Sig Dispense Refill  . aspirin 81 MG  tablet Take 81 mg by mouth daily.    . clobetasol cream (TEMOVATE) 6.31 % Apply 1 application topically as needed (for rash).     . diphenhydrAMINE (BENADRYL) 25 MG tablet Take 25 mg by mouth every 6 (six) hours as needed for itching or allergies.    Marland Kitchen EPINEPHrine 0.3 mg/0.3 mL IJ SOAJ injection Inject 0.3 mg into the muscle once. Allergic to red meat.    Marland Kitchen ibuprofen (ADVIL,MOTRIN) 100 MG tablet Take 100 mg by mouth every 8 (eight) hours as needed for pain or fever.    . mupirocin ointment (BACTROBAN) 2 % Apply 1 application topically as needed (for skin).     Marland Kitchen triamcinolone (NASACORT) 55 MCG/ACT AERO nasal inhaler Place 2 sprays into the nose daily as needed.     No current facility-administered medications for this visit.     Allergies  Allergen Reactions  . Meat Extract Anaphylaxis, Hives, Swelling and Other (See Comments)    Red meat. Makes her throat close and hives breakout.  Marland Kitchen Penicillins Hives    Review of Systems negative except from HPI and PMH  Physical Exam BP 130/82   Pulse 60   Ht 5\' 4"  (1.626 m)   Wt 200 lb (90.7 kg)   SpO2 98%   BMI 34.33 kg/m  Well developed and nourished in no acute distress HENT normal Neck supple with JVP-flat Clear  Device pocket well healed; without hematoma or erythema.  There is no tethering  Regular rate and rhythm, no murmurs or gallops Abd-soft with active BS No Clubbing cyanosis edema Skin-warm and dry A & Oriented  Grossly normal sensory and motor function    ECG Apacing @ 60 16/07/38  Assessment and  Plan  Sinus node dysfunction  Atrial fibrillation-paroxysmal-remote  Pacemaker-[employer]  The patient's device was interrogated.  The information was reviewed. No changes were made in the programming.      No evidence of atrial fib,  Will stop asa And follow

## 2018-12-16 NOTE — Patient Instructions (Signed)
Medication Instructions:   Stop your Aspirin beginning today.  Labwork: None ordered.  Testing/Procedures: None ordered.  Follow-Up: Your physician recommends that you schedule a follow-up appointment in:   One Year with Dillon Bjork, PA   Remote monitoring is used to monitor your Pacemaker from home. This monitoring reduces the number of office visits required to check your device to one time per year. It allows [place] to keep an eye on the functioning of your device to ensure it is working properly. You are scheduled for a device check from home on 01/01/2019. You may send your transmission at any time that day. If you have a wireless device, the transmission will be sent automatically. After your physician reviews your transmission, you will receive a postcard with your next transmission date.   Any Other Special Instructions Will Be Listed Below (If Applicable).     If you need a refill on your cardiac medications before your next appointment, please call your pharmacy.

## 2018-12-17 LAB — CUP PACEART INCLINIC DEVICE CHECK
Date Time Interrogation Session: 20200107050000
Implantable Lead Implant Date: 20041111
Implantable Lead Location: 753859
Implantable Lead Location: 753860
Implantable Lead Model: 4088
Implantable Lead Model: 4473
Implantable Lead Serial Number: 209644
Implantable Lead Serial Number: 420854
Implantable Pulse Generator Implant Date: 20170412
Lead Channel Impedance Value: 434 Ohm
Lead Channel Impedance Value: 642 Ohm
Lead Channel Pacing Threshold Amplitude: 1.1 V
Lead Channel Pacing Threshold Amplitude: 1.2 V
Lead Channel Pacing Threshold Pulse Width: 0.4 ms
Lead Channel Pacing Threshold Pulse Width: 0.4 ms
Lead Channel Sensing Intrinsic Amplitude: 21.5 mV
Lead Channel Sensing Intrinsic Amplitude: 3.5 mV
Lead Channel Setting Pacing Amplitude: 2 V
Lead Channel Setting Pacing Amplitude: 2.5 V
Lead Channel Setting Pacing Pulse Width: 0.4 ms
Lead Channel Setting Sensing Sensitivity: 2.5 mV
MDC IDC LEAD IMPLANT DT: 20041111
MDC IDC PG SERIAL: 718133

## 2019-01-01 ENCOUNTER — Ambulatory Visit (INDEPENDENT_AMBULATORY_CARE_PROVIDER_SITE_OTHER): Payer: Medicare Other

## 2019-01-01 DIAGNOSIS — I442 Atrioventricular block, complete: Secondary | ICD-10-CM | POA: Diagnosis not present

## 2019-01-01 DIAGNOSIS — I495 Sick sinus syndrome: Secondary | ICD-10-CM

## 2019-01-02 ENCOUNTER — Encounter: Payer: Self-pay | Admitting: Cardiology

## 2019-01-02 NOTE — Progress Notes (Signed)
Remote pacemaker transmission.   

## 2019-01-04 LAB — CUP PACEART REMOTE DEVICE CHECK
Battery Remaining Longevity: 144 mo
Battery Remaining Percentage: 100 %
Brady Statistic RA Percent Paced: 64 %
Brady Statistic RV Percent Paced: 0 %
Date Time Interrogation Session: 20200123095100
Implantable Lead Implant Date: 20041111
Implantable Lead Location: 753859
Implantable Lead Location: 753860
Implantable Lead Model: 4088
Implantable Lead Model: 4473
Implantable Lead Serial Number: 209644
Implantable Lead Serial Number: 420854
Implantable Pulse Generator Implant Date: 20170412
Lead Channel Impedance Value: 441 Ohm
Lead Channel Impedance Value: 628 Ohm
Lead Channel Pacing Threshold Amplitude: 1.2 V
Lead Channel Pacing Threshold Amplitude: 1.3 V
Lead Channel Pacing Threshold Pulse Width: 0.4 ms
Lead Channel Pacing Threshold Pulse Width: 0.4 ms
Lead Channel Setting Pacing Amplitude: 2 V
Lead Channel Setting Pacing Amplitude: 2.5 V
Lead Channel Setting Pacing Pulse Width: 0.4 ms
Lead Channel Setting Sensing Sensitivity: 2.5 mV
MDC IDC LEAD IMPLANT DT: 20041111
Pulse Gen Serial Number: 718133

## 2019-01-13 ENCOUNTER — Other Ambulatory Visit: Payer: Self-pay | Admitting: Family Medicine

## 2019-01-13 DIAGNOSIS — Z1231 Encounter for screening mammogram for malignant neoplasm of breast: Secondary | ICD-10-CM

## 2019-01-15 ENCOUNTER — Ambulatory Visit
Admission: RE | Admit: 2019-01-15 | Discharge: 2019-01-15 | Disposition: A | Discharge status: Home / Self Care | Payer: Medicare Other | Source: Ambulatory Visit | Attending: Family Medicine | Admitting: Family Medicine

## 2019-01-15 DIAGNOSIS — Z1231 Encounter for screening mammogram for malignant neoplasm of breast: Secondary | ICD-10-CM | POA: Diagnosis present

## 2019-04-02 ENCOUNTER — Ambulatory Visit (INDEPENDENT_AMBULATORY_CARE_PROVIDER_SITE_OTHER): Payer: Medicare Other | Admitting: *Deleted

## 2019-04-02 ENCOUNTER — Other Ambulatory Visit: Payer: Self-pay

## 2019-04-02 DIAGNOSIS — I495 Sick sinus syndrome: Secondary | ICD-10-CM

## 2019-04-02 DIAGNOSIS — I442 Atrioventricular block, complete: Secondary | ICD-10-CM | POA: Diagnosis not present

## 2019-04-02 LAB — CUP PACEART REMOTE DEVICE CHECK
Battery Remaining Longevity: 144 mo
Battery Remaining Percentage: 100 %
Brady Statistic RA Percent Paced: 63 %
Brady Statistic RV Percent Paced: 0 %
Date Time Interrogation Session: 20200423090400
Implantable Lead Implant Date: 20041111
Implantable Lead Implant Date: 20041111
Implantable Lead Location: 753859
Implantable Lead Location: 753860
Implantable Lead Model: 4088
Implantable Lead Model: 4473
Implantable Lead Serial Number: 209644
Implantable Lead Serial Number: 420854
Implantable Pulse Generator Implant Date: 20170412
Lead Channel Impedance Value: 431 Ohm
Lead Channel Impedance Value: 621 Ohm
Lead Channel Pacing Threshold Amplitude: 1.2 V
Lead Channel Pacing Threshold Amplitude: 1.4 V
Lead Channel Pacing Threshold Pulse Width: 0.4 ms
Lead Channel Pacing Threshold Pulse Width: 0.4 ms
Lead Channel Setting Pacing Amplitude: 2 V
Lead Channel Setting Pacing Amplitude: 2.5 V
Lead Channel Setting Pacing Pulse Width: 0.4 ms
Lead Channel Setting Sensing Sensitivity: 2.5 mV
Pulse Gen Serial Number: 718133

## 2019-04-09 NOTE — Progress Notes (Signed)
Remote pacemaker transmission.   

## 2019-05-11 DIAGNOSIS — R7303 Prediabetes: Secondary | ICD-10-CM | POA: Insufficient documentation

## 2019-05-11 DIAGNOSIS — N3281 Overactive bladder: Secondary | ICD-10-CM | POA: Insufficient documentation

## 2019-05-11 DIAGNOSIS — Z9989 Dependence on other enabling machines and devices: Secondary | ICD-10-CM | POA: Insufficient documentation

## 2019-05-11 DIAGNOSIS — I442 Atrioventricular block, complete: Secondary | ICD-10-CM | POA: Insufficient documentation

## 2019-05-11 DIAGNOSIS — G4733 Obstructive sleep apnea (adult) (pediatric): Secondary | ICD-10-CM | POA: Insufficient documentation

## 2019-05-11 DIAGNOSIS — N393 Stress incontinence (female) (male): Secondary | ICD-10-CM | POA: Insufficient documentation

## 2019-07-02 ENCOUNTER — Ambulatory Visit (INDEPENDENT_AMBULATORY_CARE_PROVIDER_SITE_OTHER): Payer: Medicare Other | Admitting: *Deleted

## 2019-07-02 DIAGNOSIS — I495 Sick sinus syndrome: Secondary | ICD-10-CM | POA: Diagnosis not present

## 2019-07-02 DIAGNOSIS — I480 Paroxysmal atrial fibrillation: Secondary | ICD-10-CM

## 2019-07-02 LAB — CUP PACEART REMOTE DEVICE CHECK
Battery Remaining Longevity: 144 mo
Battery Remaining Percentage: 100 %
Brady Statistic RA Percent Paced: 67 %
Brady Statistic RV Percent Paced: 0 %
Date Time Interrogation Session: 20200723085000
Implantable Lead Implant Date: 20041111
Implantable Lead Implant Date: 20041111
Implantable Lead Location: 753859
Implantable Lead Location: 753860
Implantable Lead Model: 4088
Implantable Lead Model: 4473
Implantable Lead Serial Number: 209644
Implantable Lead Serial Number: 420854
Implantable Pulse Generator Implant Date: 20170412
Lead Channel Impedance Value: 418 Ohm
Lead Channel Impedance Value: 605 Ohm
Lead Channel Pacing Threshold Amplitude: 1.1 V
Lead Channel Pacing Threshold Amplitude: 1.5 V
Lead Channel Pacing Threshold Pulse Width: 0.4 ms
Lead Channel Pacing Threshold Pulse Width: 0.4 ms
Lead Channel Setting Pacing Amplitude: 2 V
Lead Channel Setting Pacing Amplitude: 2.5 V
Lead Channel Setting Pacing Pulse Width: 0.4 ms
Lead Channel Setting Sensing Sensitivity: 2.5 mV
Pulse Gen Serial Number: 718133

## 2019-07-13 NOTE — Progress Notes (Signed)
Remote pacemaker transmission.   

## 2019-10-02 ENCOUNTER — Ambulatory Visit (INDEPENDENT_AMBULATORY_CARE_PROVIDER_SITE_OTHER): Payer: Medicare Other | Admitting: *Deleted

## 2019-10-02 DIAGNOSIS — I495 Sick sinus syndrome: Secondary | ICD-10-CM

## 2019-10-02 DIAGNOSIS — I480 Paroxysmal atrial fibrillation: Secondary | ICD-10-CM

## 2019-10-04 LAB — CUP PACEART REMOTE DEVICE CHECK
Battery Remaining Longevity: 138 mo
Battery Remaining Percentage: 100 %
Brady Statistic RA Percent Paced: 69 %
Brady Statistic RV Percent Paced: 0 %
Date Time Interrogation Session: 20201023085100
Implantable Lead Implant Date: 20041111
Implantable Lead Implant Date: 20041111
Implantable Lead Location: 753859
Implantable Lead Location: 753860
Implantable Lead Model: 4088
Implantable Lead Model: 4473
Implantable Lead Serial Number: 209644
Implantable Lead Serial Number: 420854
Implantable Pulse Generator Implant Date: 20170412
Lead Channel Impedance Value: 425 Ohm
Lead Channel Impedance Value: 617 Ohm
Lead Channel Pacing Threshold Amplitude: 1.1 V
Lead Channel Pacing Threshold Amplitude: 1.3 V
Lead Channel Pacing Threshold Pulse Width: 0.4 ms
Lead Channel Pacing Threshold Pulse Width: 0.4 ms
Lead Channel Setting Pacing Amplitude: 2 V
Lead Channel Setting Pacing Amplitude: 2.5 V
Lead Channel Setting Pacing Pulse Width: 0.4 ms
Lead Channel Setting Sensing Sensitivity: 2.5 mV
Pulse Gen Serial Number: 718133

## 2019-10-09 NOTE — Progress Notes (Signed)
Remote pacemaker transmission.   

## 2019-10-21 ENCOUNTER — Other Ambulatory Visit: Payer: Self-pay | Admitting: Family Medicine

## 2019-10-21 DIAGNOSIS — Z1231 Encounter for screening mammogram for malignant neoplasm of breast: Secondary | ICD-10-CM

## 2019-12-15 NOTE — Progress Notes (Signed)
Cardiology Office Note Date:  12/15/2019  Patient ID:  Denise [name] 1946/08/31, MRN [id number] PCP:  [place] Pink, MD  Cardiologist:  Dr. Caryl Comes   Chief Complaint: annual EP visit  History of Present Illness: Denise [name] is a 74 y.o. female with history of PPM 2/2 syncope, and paroxysmal AFib.  She comes today to be seen for Dr. Caryl Comes, last seen by him Jan 2020, she was doing well, no AFib noted, her ASA was stopped.    She comes in today mentioning that since waking today her L arm feels weak.  Not numb, tingling, no pain, but weak.  No headache, no dizziness, or changes in vision, no LLE weakness or change. She mentions in Dec she fell twice, bot were trip/slip falls.  One she was walking in the rain and slipped in some mud,  The 2nd was a couple weeks ago, was walking on her property at night, poor lighting and tripped on a pile of dirt and fell.  She denies injuries with either, and neither associated with any symptoms, no dizziness, she did not faint.  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  She is up and working hard every day.  No CP, palpitations or cardiac awareness.  She does say that in the last year, when hauling the eggs from the chicken house to the house for cleaning (carrying a heavy load) she will sometimes need to rest 1/2 way and a year ago did not have to.  Other then that she has not noted any other exertional intolerances.   She denies any dizzy spells, no near syncope or syncope. Says she feels like she is really doing well, does not feel her age.  She infrequentoy checks her BP and reports typically 120's/70's, rarely higher and very surprised about her BP today   Device information: BSCi dual chamber PPM, s/p gen change 03/21/16, initial implant 2004   Past Medical History:  Diagnosis Date  . Anxiety   . Arrhythmia    a-fib  . Depressive disorder, not  elsewhere classified   . Heart disease, unspecified   . Hives   . [employer] 08/14/2012  . Paroxysmal A-fib ([place])   . Sinoatrial node dysfunction (HCC)   . Sleep apnea   . Syncope and collapse     Past Surgical History:  Procedure Laterality Date  . BREAST BIOPSY Left 1989  . CARDIAC CATHETERIZATION  2004   [place]  . COLONOSCOPY WITH PROPOFOL N/A 04/02/2018   Procedure: COLONOSCOPY WITH PROPOFOL;  Surgeon: Manya Silvas, MD;  Location: [facility] ENDOSCOPY;  Service: Endoscopy;  Laterality: N/A;  . EP IMPLANTABLE DEVICE N/A 03/21/2016   Procedure: PPM Generator Changeout;  Surgeon: Deboraha Sprang, MD;  Location: [place] CV LAB;  Service: Cardiovascular;  Laterality: N/A;  . INSERT / REPLACE / REMOVE PACEMAKER  10/21/2003   [place]/ [employer]    Current Outpatient Medications  Medication Sig Dispense Refill  . clobetasol cream (TEMOVATE) [id number] % Apply 1 application topically as needed (for rash).     . diphenhydrAMINE (BENADRYL) 25 MG tablet Take 25 mg by mouth every 6 (six) hours as needed for itching or allergies.    Marland Kitchen EPINEPHrine 0.3 mg/0.3 mL IJ SOAJ injection Inject 0.3 mg into the muscle once. Allergic to red meat.    Marland Kitchen ibuprofen (ADVIL,MOTRIN) 100 MG tablet Take 100 mg by mouth every  8 (eight) hours as needed for pain or fever.    . mupirocin ointment (BACTROBAN) 2 % Apply 1 application topically as needed (for skin).     Marland Kitchen triamcinolone (NASACORT) 55 MCG/ACT AERO nasal inhaler Place 2 sprays into the nose daily as needed.     No current facility-administered medications for this visit.    Allergies:   Meat extract and Penicillins   Social History:  The patient  reports that she has never smoked. She has never used smokeless tobacco. She reports that she does not drink alcohol or use drugs.   Family History:  The patient's family history includes Hypertension in her mother, sister, and sister.  ROS:  Please see the history of present illness.  All  other systems are reviewed and otherwise negative.   PHYSICAL EXAM:  VS:  There were no vitals taken for this visit. BMI: There is no height or weight on file to calculate BMI. Well nourished, well developed, in no acute distress  HEENT: normocephalic, atraumatic  Neck: no JVD, carotid bruits or masses Cardiac:  RRR; no significant murmurs, no rubs, or gallops Lungs:  CTA b/l, no wheezing, rhonchi or rales  Abd: soft, non-tender, obese MS: no deformity or atrophy Ext: no edema  Skin: warm and dry, no rash Neuro:  No gross deficits appreciated, she has excellent b/l UE strength without appreciable deficits or ROM limitations  Psych: euthymic mood, full affect  PPM site is stable, no tethering or discomfort   EKG:  Not dine today   PPM interrogation done today and reviewed by myself:    She is AP/VS presenting Battery and lead measurements are good 69%AP <1%VP One ATR, EGM is an AT one second duration   02/27/16: lexiscan stress myoview  There was no ST segment deviation noted during stress.  No T wave inversion was noted during stress.  The study is normal.  This is a low risk study.  The left ventricular ejection fraction is normal (55-65%). Myocardial perfusion is normal. The study is normal. This is a low risk study. Overall left ventricular systolic function was normal. LV cavity size is normal. The left ventricular ejection fraction is normal (55-65%).   Recent Labs: No results found for requested labs within last 8760 hours.  No results found for requested labs within last 8760 hours.   CrCl cannot be calculated (Patient's most recent lab result is older than the maximum 21 days allowed.).   Wt Readings from Last 3 Encounters:  12/16/18 200 lb (90.7 kg)  09/22/18 190 lb 14.7 oz (86.6 kg)  04/02/18 191 lb (86.6 kg)     Other studies reviewed: Additional studies/records reviewed today include: summarized above  ASSESSMENT AND PLAN:  1. PPM     Intact  function, no changes made  2. Paroxysmal AFib     Unclear when this occurred     Dr. Olin Pia notes have discussed no interval AFib has been noted, not on a/c     No AFib on today's interrogation noted  3. LUE weakness     Stat CT w/o contrast brain was done here today, No acute intracranial abnormality. Specifically, no hemorrhage, hydrocephalus, mass lesion, acute infarction, or significant intracranial injury  She is in SR today (via device), no Afib is noted since prior interrogation       Her pacer wires are NOT MRI compatible  Dr. Caryl Comes saw and examined the patient Observed LUE appeared slightly larger then the R (she is R handed),  on his exam he noted some degree of R hand weakness (grip) These findings of unclear significance  Given L sided pacer will get LUE venous US done And advised the patient to see her PMD for further w/u and management   4. Reduced exertional capacity (as described above)     Will get an echo to start  5. High BP      Recheck, 152/84     She reports normal BP readings at home     She is asked to check daily and notify if regularly  >140/80     Disposition: will continue Q 23mo device remotes, get her echo and venous US done, pending those for further follow up.  Current medicines are reviewed at length with the patient today.  The patient did not have any concerns regarding medicines.  Venetia Night, PA-C 12/15/2019 7:45 PM     [address] [place] [address] [phone] (office)  [phone] (fax)

## 2019-12-16 ENCOUNTER — Ambulatory Visit (INDEPENDENT_AMBULATORY_CARE_PROVIDER_SITE_OTHER): Payer: Medicare Other | Admitting: Physician Assistant

## 2019-12-16 ENCOUNTER — Other Ambulatory Visit: Payer: Self-pay

## 2019-12-16 ENCOUNTER — Ambulatory Visit (INDEPENDENT_AMBULATORY_CARE_PROVIDER_SITE_OTHER)
Admission: RE | Admit: 2019-12-16 | Discharge: 2019-12-16 | Disposition: A | Discharge status: Home / Self Care | Payer: Medicare Other | Source: Ambulatory Visit | Attending: Physician Assistant | Admitting: Physician Assistant

## 2019-12-16 VITALS — BP 160/82 | HR 61 | Ht 64.0 in | Wt 198.0 lb

## 2019-12-16 DIAGNOSIS — Z950 Presence of cardiac pacemaker: Secondary | ICD-10-CM

## 2019-12-16 DIAGNOSIS — R29898 Other symptoms and signs involving the musculoskeletal system: Secondary | ICD-10-CM | POA: Diagnosis not present

## 2019-12-16 DIAGNOSIS — M79602 Pain in left arm: Secondary | ICD-10-CM | POA: Diagnosis not present

## 2019-12-16 DIAGNOSIS — R0602 Shortness of breath: Secondary | ICD-10-CM

## 2019-12-16 DIAGNOSIS — I480 Paroxysmal atrial fibrillation: Secondary | ICD-10-CM | POA: Diagnosis not present

## 2019-12-16 DIAGNOSIS — I10 Essential (primary) hypertension: Secondary | ICD-10-CM

## 2019-12-16 DIAGNOSIS — R531 Weakness: Secondary | ICD-10-CM | POA: Diagnosis not present

## 2019-12-16 NOTE — Patient Instructions (Addendum)
Medication Instructions:   Your physician recommends that you continue on your current medications as directed. Please refer to the Current Medication list given to you today.  *If you need a refill on your cardiac medications before your next appointment, please call your pharmacy*  Lab Work: [place]   If you have labs (blood work) drawn today and your tests are completely normal, you will receive your results only by: Marland Kitchen MyChart Message (if you have MyChart) OR . A paper copy in the mail If you have any lab test that is abnormal or we need to change your treatment, we will call you to review the results.  Testing/Procedures:  Your physician has requested that you have a lower or upper extremity venous duplex. This test is an ultrasound of the veins in the legs or arms. It looks at venous blood flow that carries blood from the heart to the legs or arms. Allow one hour for a Lower Venous exam. Allow thirty minutes for an Upper Venous exam. There are no restrictions or special instructions.   Your physician has requested that you have an echocardiogram. Echocardiography is a painless test that uses sound waves to create images of your heart. It provides your doctor with information about the size and shape of your heart and how well your heart's chambers and valves are working. This procedure takes approximately one hour. There are no restrictions for this procedure.    Follow-Up: At [facility], you and your health needs are our priority.  As part of our continuing mission to provide you with exceptional heart care, we have created designated Provider Care Teams.  These Care Teams include your primary Cardiologist (physician) and Advanced Practice Providers (APPs -  Physician Assistants and Nurse Practitioners) who all work together to provide you with the care you need, when you need it.   Your next appointment:   TO BE DETERMINED AFTER  TESTs   Other Instructions  CHECK   BLOOD PRESSURE  AT HOME  IF RUNNING  140/80 PLEASE CONTACT us [phone]

## 2019-12-21 ENCOUNTER — Encounter (HOSPITAL_COMMUNITY): Payer: Medicare Other

## 2020-01-01 ENCOUNTER — Ambulatory Visit (INDEPENDENT_AMBULATORY_CARE_PROVIDER_SITE_OTHER): Payer: Medicare Other | Admitting: *Deleted

## 2020-01-01 DIAGNOSIS — I480 Paroxysmal atrial fibrillation: Secondary | ICD-10-CM | POA: Diagnosis not present

## 2020-01-01 LAB — CUP PACEART REMOTE DEVICE CHECK
Battery Remaining Longevity: 138 mo
Battery Remaining Percentage: 100 %
Brady Statistic RA Percent Paced: 74 %
Brady Statistic RV Percent Paced: 0 %
Date Time Interrogation Session: 20210122045100
Implantable Lead Implant Date: 20041111
Implantable Lead Implant Date: 20041111
Implantable Lead Location: 753859
Implantable Lead Location: 753860
Implantable Lead Model: 4088
Implantable Lead Model: 4473
Implantable Lead Serial Number: 209644
Implantable Lead Serial Number: 420854
Implantable Pulse Generator Implant Date: 20170412
Lead Channel Impedance Value: 429 Ohm
Lead Channel Impedance Value: 620 Ohm
Lead Channel Pacing Threshold Amplitude: 1 V
Lead Channel Pacing Threshold Amplitude: 1.5 V
Lead Channel Pacing Threshold Pulse Width: 0.4 ms
Lead Channel Pacing Threshold Pulse Width: 0.4 ms
Lead Channel Setting Pacing Amplitude: 2 V
Lead Channel Setting Pacing Amplitude: 2.5 V
Lead Channel Setting Pacing Pulse Width: 0.4 ms
Lead Channel Setting Sensing Sensitivity: 2.5 mV
Pulse Gen Serial Number: 718133

## 2020-01-05 ENCOUNTER — Ambulatory Visit (INDEPENDENT_AMBULATORY_CARE_PROVIDER_SITE_OTHER): Payer: Medicare Other

## 2020-01-05 ENCOUNTER — Other Ambulatory Visit: Payer: Self-pay

## 2020-01-05 DIAGNOSIS — R29898 Other symptoms and signs involving the musculoskeletal system: Secondary | ICD-10-CM

## 2020-01-05 DIAGNOSIS — R0602 Shortness of breath: Secondary | ICD-10-CM | POA: Diagnosis not present

## 2020-01-05 DIAGNOSIS — R600 Localized edema: Secondary | ICD-10-CM | POA: Diagnosis not present

## 2020-01-18 ENCOUNTER — Ambulatory Visit
Admission: RE | Admit: 2020-01-18 | Discharge: 2020-01-18 | Disposition: A | Discharge status: Home / Self Care | Payer: Medicare Other | Source: Ambulatory Visit | Attending: Family Medicine | Admitting: Family Medicine

## 2020-01-18 DIAGNOSIS — Z1231 Encounter for screening mammogram for malignant neoplasm of breast: Secondary | ICD-10-CM | POA: Diagnosis not present

## 2020-01-20 ENCOUNTER — Other Ambulatory Visit: Payer: Self-pay | Admitting: Family Medicine

## 2020-01-20 DIAGNOSIS — R928 Other abnormal and inconclusive findings on diagnostic imaging of breast: Secondary | ICD-10-CM

## 2020-01-27 ENCOUNTER — Ambulatory Visit
Admission: RE | Admit: 2020-01-27 | Discharge: 2020-01-27 | Disposition: A | Discharge status: Home / Self Care | Payer: Medicare Other | Source: Ambulatory Visit | Attending: Family Medicine | Admitting: Family Medicine

## 2020-01-27 DIAGNOSIS — R928 Other abnormal and inconclusive findings on diagnostic imaging of breast: Secondary | ICD-10-CM

## 2020-03-22 ENCOUNTER — Emergency Department: Payer: Medicare Other

## 2020-03-22 ENCOUNTER — Emergency Department
Admission: EM | Admit: 2020-03-22 | Discharge: 2020-03-22 | Disposition: A | Discharge status: Home / Self Care | Payer: Medicare Other | Attending: Emergency Medicine | Admitting: Emergency Medicine

## 2020-03-22 ENCOUNTER — Other Ambulatory Visit: Payer: Self-pay

## 2020-03-22 ENCOUNTER — Encounter: Payer: Self-pay | Admitting: Emergency Medicine

## 2020-03-22 DIAGNOSIS — Z79899 Other long term (current) drug therapy: Secondary | ICD-10-CM | POA: Diagnosis not present

## 2020-03-22 DIAGNOSIS — I4891 Unspecified atrial fibrillation: Secondary | ICD-10-CM | POA: Diagnosis not present

## 2020-03-22 DIAGNOSIS — R109 Unspecified abdominal pain: Secondary | ICD-10-CM | POA: Diagnosis present

## 2020-03-22 DIAGNOSIS — Z950 Presence of cardiac pacemaker: Secondary | ICD-10-CM | POA: Insufficient documentation

## 2020-03-22 LAB — BASIC METABOLIC PANEL
Anion gap: 7 (ref 5–15)
BUN: 25 mg/dL — ABNORMAL HIGH (ref 8–23)
CO2: 27 mmol/L (ref 22–32)
Calcium: 8.8 mg/dL — ABNORMAL LOW (ref 8.9–10.3)
Chloride: 106 mmol/L (ref 98–111)
Creatinine, Ser: 1.01 mg/dL — ABNORMAL HIGH (ref 0.44–1.00)
GFR calc Af Amer: >60 mL/min (ref >60)
GFR calc non Af Amer: 55 mL/min — ABNORMAL LOW (ref >60)
Glucose, Bld: 98 mg/dL (ref 70–99)
Potassium: 4.1 mmol/L (ref 3.5–5.1)
Sodium: 140 mmol/L (ref 135–145)

## 2020-03-22 LAB — CBC
HCT: 40 % (ref 36.0–46.0)
Hemoglobin: 12.8 g/dL (ref 12.0–15.0)
MCH: 30 pg (ref 26.0–34.0)
MCHC: 32 g/dL (ref 30.0–36.0)
MCV: 93.7 fL (ref 80.0–100.0)
Platelets: 142 10*3/uL — ABNORMAL LOW (ref 150–400)
RBC: 4.27 MIL/uL (ref 3.87–5.11)
RDW: 15.5 % (ref 11.5–15.5)
WBC: 4.4 10*3/uL (ref 4.0–10.5)
nRBC: 0 % (ref 0.0–0.2)

## 2020-03-22 MED ORDER — KETOROLAC TROMETHAMINE 30 MG/ML IJ SOLN
15.0000 mg | Freq: Once | INTRAMUSCULAR | Status: AC
  Administered 2020-03-22: 19:00:00 15 mg via INTRAVENOUS
  Filled 2020-03-22: qty 1

## 2020-03-22 MED ORDER — NAPROXEN 500 MG PO TABS: 500.0000 mg | ORAL_TABLET | Freq: Two times a day (BID) | ORAL | 2 refills | Status: DC

## 2020-03-22 NOTE — ED Triage Notes (Addendum)
Patient presents to the ED with severe left flank pain.  Patient went to her PCP today and they did a urinalysis and blood work and sent patient to the ED.  Patient states she had some pain 1 week ago but pain has gotten worse, and is worse with breathing and states pain is very sharp.  Patient states her PCP told her she would need a CT scan.  Patient denies any new urinary symptoms.  Reports some incontinence normally.

## 2020-03-22 NOTE — ED Provider Notes (Signed)
[facility] Emergency Department Provider Note   ____________________________________________    I have reviewed the triage vital signs and the nursing notes.   HISTORY  Chief Complaint Flank Pain     HPI Denise [name] is a 74 y.o. female with a history as below who presents with complaints of left-sided flank pain.  Patient describes intermittent flank pain that has been ongoing for over a week now.  She has been working with her PCP and was on a course of antibiotics which seemed to improve her symptoms.  However they returned today and seemed to be worse than before.  Denies hematuria.  Denies vaginal bleeding.  Mild nausea no vomiting.  No history of kidney stones.  Has not take anything for this today.  Past Medical History:  Diagnosis Date  . Anxiety   . Arrhythmia    a-fib  . Depressive disorder, not elsewhere classified   . Heart disease, unspecified   . Hives   . [employer] 08/14/2012  . Paroxysmal A-fib ([place])   . Sinoatrial node dysfunction (HCC)   . Sleep apnea   . Syncope and collapse     Patient Active Problem List   Diagnosis Date Noted  . [employer] 08/14/2012  . Atrial fibrillation ([place]) 08/14/2012  . Sinus node dysfunction ([place]) 08/14/2012  . Anaphylaxis/allergic reaction 08/14/2012    Past Surgical History:  Procedure Laterality Date  . BREAST BIOPSY Left 1989  . CARDIAC CATHETERIZATION  2004   [place]  . COLONOSCOPY WITH PROPOFOL N/A 04/02/2018   Procedure: COLONOSCOPY WITH PROPOFOL;  Surgeon: Manya Silvas, MD;  Location: [facility] ENDOSCOPY;  Service: Endoscopy;  Laterality: N/A;  . EP IMPLANTABLE DEVICE N/A 03/21/2016   Procedure: PPM Generator Changeout;  Surgeon: Deboraha Sprang, MD;  Location: [facility] CV LAB;  Service: Cardiovascular;  Laterality: N/A;  . INSERT / REPLACE / REMOVE PACEMAKER  10/21/2003   [place]/ [employer]    Prior to Admission medications   Medication  Sig Start Date End Date Taking? Authorizing Provider  cetirizine (ZYRTEC) 10 MG tablet Take 10 mg by mouth as needed. 05/05/19 05/04/20  [provider]  clobetasol cream (TEMOVATE) [id number] % Apply 1 application topically as needed (for rash).  06/08/14   [provider]  diphenhydrAMINE (BENADRYL) 25 MG tablet Take 25 mg by mouth every 6 (six) hours as needed for itching or allergies.    [provider]  EPINEPHrine 0.3 mg/0.3 mL IJ SOAJ injection Inject 0.3 mg into the muscle once. Allergic to red meat.    [provider]  fluticasone (FLONASE) 50 MCG/ACT nasal spray Place 1 spray into both nostrils as needed. 12/05/19   [provider]  ibuprofen (ADVIL,MOTRIN) 100 MG tablet Take 100 mg by mouth every 8 (eight) hours as needed for pain or fever.    [provider]  mupirocin ointment (BACTROBAN) 2 % Apply 1 application topically as needed (for skin).  06/08/14   [provider]  naproxen (NAPROSYN) 500 MG tablet Take 1 tablet (500 mg total) by mouth 2 (two) times daily with a meal. 03/22/20   Lavonia Drafts, MD  triamcinolone (NASACORT) 55 MCG/ACT AERO nasal inhaler Place 2 sprays into the nose daily as needed. 12/17/17 12/17/18  [provider]     Allergies Meat extract, Beef (bovine) protein, Other, Penicillin g, and Penicillins  Family History  Problem Relation Age of Onset  . Hypertension Mother   . Hypertension Sister   .  Hypertension Sister   . Breast cancer Neg Hx     Social History Social History   Tobacco Use  . Smoking status: Never Smoker  . Smokeless tobacco: Never Used  Substance Use Topics  . Alcohol use: No  . Drug use: No    Review of Systems  Constitutional: No fever/chills Eyes: No visual changes.  ENT: No sore throat. Cardiovascular: Denies chest pain. Respiratory: Denies shortness of breath. Gastrointestinal: As above Genitourinary: No dysuria, no hematuria Musculoskeletal: Negative for back  pain. Skin: Negative for rash. Neurological: Negative for headaches or weakness   ____________________________________________   PHYSICAL EXAM:  VITAL SIGNS: ED Triage Vitals  Enc Vitals Group     BP 03/22/20 1617 (!) 147/86     Pulse Rate 03/22/20 1617 65     Resp 03/22/20 1617 20     Temp 03/22/20 1617 98.5 F (36.9 C)     Temp Source 03/22/20 1617 Oral     SpO2 03/22/20 1617 98 %     Weight 03/22/20 1618 88.9 kg (196 lb)     Height 03/22/20 1618 1.626 m (5\' 4" )     Head Circumference --      Peak Flow --      Pain Score 03/22/20 1634 9     Pain Loc --      Pain Edu? --      Excl. in [place]? --     Constitutional: Alert and oriented.   Nose: No congestion/rhinnorhea. Mouth/Throat: Mucous membranes are moist.   Neck:  Painless ROM Cardiovascular: Normal rate, regular rhythm.  Good peripheral circulation. Respiratory: Normal respiratory effort.  No retractions. Lungs CTAB. Gastrointestinal: Soft and nontender. No distention.  No CVA tenderness.  Reassuring exam  Musculoskeletal: Warm and well perfused Neurologic:  Normal speech and language. No gross focal neurologic deficits are appreciated.  Skin:  Skin is warm, dry and intact. No rash noted.  No evidence of shingles Psychiatric: Mood and affect are normal. Speech and behavior are normal.  ____________________________________________   LABS (all labs ordered are listed, but only abnormal results are displayed)  Labs Reviewed  CBC - Abnormal; Notable for the following components:      Result Value   Platelets 142 (*)    All other components within normal limits  BASIC METABOLIC PANEL - Abnormal; Notable for the following components:   BUN 25 (*)    Creatinine, Ser 1.01 (*)    Calcium 8.8 (*)    GFR calc non Af Amer 55 (*)    All other components within normal limits  URINALYSIS, COMPLETE (UACMP) WITH MICROSCOPIC    ____________________________________________  EKG  None ____________________________________________  RADIOLOGY  CT renal stone study negative for ureterolithiasis.  Discussed cystic structure on the right with patient and the need for outpatient MRI, she agrees ____________________________________________   PROCEDURES  Procedure(s) performed: No  Procedures   Critical Care performed: No ____________________________________________   INITIAL IMPRESSION / ASSESSMENT AND PLAN / ED COURSE  Pertinent labs & imaging results that were available during my care of the patient were reviewed by me and considered in my medical decision making (see chart for details).  Patient presents with left flank pain.  Differential includes ureterolithiasis, musculoskeletal pain, urinary tract infection.  Urinalysis is overall reassuring, will treat with low-dose IV Toradol and obtain CT renal stone study to evaluate for kidney stone.  Patient had near total resolution of pain with Toradol.  CT scan is overall reassuring, no evidence of ureterolithiasis, discussed  incidental findings with the patient the need for outpatient follow-up.  Patient's lab work is reassuring as well, urinalysis not consistent with urinary tract infection at outside facility.  Proper for discharge at this time with close follow-up with PCP for further evaluation, return precautions discussed    ____________________________________________   FINAL CLINICAL IMPRESSION(S) / ED DIAGNOSES  Final diagnoses:  Flank pain        Note:  This document was prepared using Dragon voice recognition software and may include unintentional dictation errors.   Lavonia Drafts, MD 03/22/20 2056

## 2020-03-22 NOTE — Discharge Instructions (Signed)
Your CT scan today and lab work and urinalysis were reassuring.  We did see a cystic structure above your right adrenal gland that requires a follow-up MRI, please discuss with your physician.

## 2020-03-23 ENCOUNTER — Other Ambulatory Visit: Payer: Self-pay | Admitting: Student

## 2020-03-23 DIAGNOSIS — N9489 Other specified conditions associated with female genital organs and menstrual cycle: Secondary | ICD-10-CM

## 2020-03-31 ENCOUNTER — Telehealth: Payer: Self-pay | Admitting: Internal Medicine

## 2020-03-31 NOTE — Telephone Encounter (Signed)
° °  Molly from [facility] calling, they would like to get Dr. Olin Pia recommendation where can the pt get MRI since she has a pacemaker.  Please advise

## 2020-04-01 ENCOUNTER — Ambulatory Visit (INDEPENDENT_AMBULATORY_CARE_PROVIDER_SITE_OTHER): Payer: Medicare Other | Admitting: *Deleted

## 2020-04-01 DIAGNOSIS — Z950 Presence of cardiac pacemaker: Secondary | ICD-10-CM

## 2020-04-01 LAB — CUP PACEART REMOTE DEVICE CHECK
Battery Remaining Longevity: 132 mo
Battery Remaining Percentage: 100 %
Brady Statistic RA Percent Paced: 71 %
Brady Statistic RV Percent Paced: 0 %
Date Time Interrogation Session: 20210423045100
Implantable Lead Implant Date: 20041111
Implantable Lead Implant Date: 20041111
Implantable Lead Location: 753859
Implantable Lead Location: 753860
Implantable Lead Model: 4088
Implantable Lead Model: 4473
Implantable Lead Serial Number: 209644
Implantable Lead Serial Number: 420854
Implantable Pulse Generator Implant Date: 20170412
Lead Channel Impedance Value: 424 Ohm
Lead Channel Impedance Value: 621 Ohm
Lead Channel Pacing Threshold Amplitude: 1.1 V
Lead Channel Pacing Threshold Amplitude: 1.4 V
Lead Channel Pacing Threshold Pulse Width: 0.4 ms
Lead Channel Pacing Threshold Pulse Width: 0.4 ms
Lead Channel Setting Pacing Amplitude: 2 V
Lead Channel Setting Pacing Amplitude: 2.5 V
Lead Channel Setting Pacing Pulse Width: 0.4 ms
Lead Channel Setting Sensing Sensitivity: 2.5 mV
Pulse Gen Serial Number: 718133

## 2020-04-01 NOTE — Progress Notes (Signed)
PPM Remote  

## 2020-04-01 NOTE — Telephone Encounter (Signed)
LM with receptionist requesting call back from [place]. Direct DC number provided.

## 2020-04-01 NOTE — Telephone Encounter (Signed)
Spoke with Joelene Millin, CMA at [facility]. Advised that patient's PPM and leads are not MRI-conditional. Explained safe to have CT scan done with PPM, but not MRI. Joelene Millin verbalizes understanding and will make ordering MD aware.

## 2020-04-05 ENCOUNTER — Ambulatory Visit: Payer: Medicare Other

## 2020-07-01 ENCOUNTER — Ambulatory Visit (INDEPENDENT_AMBULATORY_CARE_PROVIDER_SITE_OTHER): Payer: Medicare Other | Admitting: *Deleted

## 2020-07-01 DIAGNOSIS — I442 Atrioventricular block, complete: Secondary | ICD-10-CM | POA: Diagnosis not present

## 2020-07-01 LAB — CUP PACEART REMOTE DEVICE CHECK
Battery Remaining Longevity: 132 mo
Battery Remaining Percentage: 100 %
Brady Statistic RA Percent Paced: 69 %
Brady Statistic RV Percent Paced: 0 %
Date Time Interrogation Session: 20210723045100
Implantable Lead Implant Date: 20041111
Implantable Lead Implant Date: 20041111
Implantable Lead Location: 753859
Implantable Lead Location: 753860
Implantable Lead Model: 4088
Implantable Lead Model: 4473
Implantable Lead Serial Number: 209644
Implantable Lead Serial Number: 420854
Implantable Pulse Generator Implant Date: 20170412
Lead Channel Impedance Value: 419 Ohm
Lead Channel Impedance Value: 612 Ohm
Lead Channel Pacing Threshold Amplitude: 1.3 V
Lead Channel Pacing Threshold Amplitude: 1.6 V
Lead Channel Pacing Threshold Pulse Width: 0.4 ms
Lead Channel Pacing Threshold Pulse Width: 0.4 ms
Lead Channel Setting Pacing Amplitude: 2 V
Lead Channel Setting Pacing Amplitude: 2.5 V
Lead Channel Setting Pacing Pulse Width: 0.4 ms
Lead Channel Setting Sensing Sensitivity: 2.5 mV
Pulse Gen Serial Number: 718133

## 2020-07-04 NOTE — Progress Notes (Signed)
Remote pacemaker transmission.   

## 2020-07-21 ENCOUNTER — Encounter (INDEPENDENT_AMBULATORY_CARE_PROVIDER_SITE_OTHER): Payer: Self-pay | Admitting: Vascular Surgery

## 2020-07-21 ENCOUNTER — Ambulatory Visit (INDEPENDENT_AMBULATORY_CARE_PROVIDER_SITE_OTHER): Payer: Medicare Other | Admitting: Vascular Surgery

## 2020-07-21 ENCOUNTER — Other Ambulatory Visit: Payer: Self-pay

## 2020-07-21 VITALS — BP 160/90 | HR 69 | Ht 62.0 in | Wt 197.0 lb

## 2020-07-21 DIAGNOSIS — M79606 Pain in leg, unspecified: Secondary | ICD-10-CM | POA: Diagnosis not present

## 2020-07-21 DIAGNOSIS — I480 Paroxysmal atrial fibrillation: Secondary | ICD-10-CM

## 2020-07-21 DIAGNOSIS — N9489 Other specified conditions associated with female genital organs and menstrual cycle: Secondary | ICD-10-CM | POA: Insufficient documentation

## 2020-07-21 NOTE — Progress Notes (Signed)
MRN : [id number]  Denise [name] is a 74 y.o. (08/15/1946) female who presents with chief complaint of  Chief Complaint  Patient presents with  . New Patient (Initial Visit)    [place]. Abnormal Abi  .  History of Present Illness:   The patient is seen for evaluation of abnormal vascular screening from a home visit.  She also is having pain in her leg after a fall about three months ago and is thinking that the pain is related to the "vascular problem".  Patient notes the pain is variable and not always associated with activity.  The pain is somewhat consistent day to day occurring on most days. The patient notes the pain also occurs with standing and routinely seems worse as the day wears on. The pain has been progressive over the past several years.   The patient denies rest pain or dangling of an extremity off the side of the bed during the night for relief. No open wounds or sores at this time. No history of DVT or phlebitis. No prior interventions or surgeries.  There is no history of back problems and DJD of the lumbar and sacral spine.    Current Meds  Medication Sig  . ciprofloxacin (CIPRO) 250 MG tablet Take 250 mg by mouth 2 (two) times daily.  . clobetasol cream (TEMOVATE) 7.16 % Apply 1 application topically as needed (for rash).   . diphenhydrAMINE (BENADRYL) 25 MG tablet Take 25 mg by mouth every 6 (six) hours as needed for itching or allergies.  Marland Kitchen EPINEPHrine 0.3 mg/0.3 mL IJ SOAJ injection Inject 0.3 mg into the muscle once. Allergic to red meat.  Marland Kitchen EPINEPHrine 0.3 mg/0.3 mL IJ SOAJ injection Inject into the muscle.  . fluticasone (FLONASE) 50 MCG/ACT nasal spray Place 1 spray into both nostrils as needed.  . fluticasone (FLONASE) 50 MCG/ACT nasal spray Place into the nose.  Marland Kitchen HYDROcodone-acetaminophen (NORCO/VICODIN) 5-325 MG tablet SMARTSIG:1 Tablet(s) By Mouth 4-5 Times Daily  . ibuprofen (ADVIL,MOTRIN) 100 MG tablet Take 100 mg by mouth every 8 (eight) hours as  needed for pain or fever.  . mupirocin ointment (BACTROBAN) 2 % Apply 1 application topically as needed (for skin).   . naproxen (NAPROSYN) 500 MG tablet Take 1 tablet (500 mg total) by mouth 2 (two) times daily with a meal.  . nitrofurantoin, macrocrystal-monohydrate, (MACROBID) 100 MG capsule Take 100 mg by mouth 2 (two) times daily.    Past Medical History:  Diagnosis Date  . Anxiety   . Arrhythmia    a-fib  . Depressive disorder, not elsewhere classified   . Heart disease, unspecified   . Hives   . [employer] 08/14/2012  . Paroxysmal A-fib ([place])   . Sinoatrial node dysfunction (HCC)   . Sleep apnea   . Syncope and collapse     Past Surgical History:  Procedure Laterality Date  . BREAST BIOPSY Left 1989  . CARDIAC CATHETERIZATION  2004   [place]  . COLONOSCOPY WITH PROPOFOL N/A 04/02/2018   Procedure: COLONOSCOPY WITH PROPOFOL;  Surgeon: Manya Silvas, MD;  Location: [facility] ENDOSCOPY;  Service: Endoscopy;  Laterality: N/A;  . EP IMPLANTABLE DEVICE N/A 03/21/2016   Procedure: PPM Generator Changeout;  Surgeon: Deboraha Sprang, MD;  Location: [facility] CV LAB;  Service: Cardiovascular;  Laterality: N/A;  . INSERT / REPLACE / REMOVE PACEMAKER  10/21/2003   [place]/ [employer]    Social History Social History   Tobacco Use  . Smoking status:  Never Smoker  . Smokeless tobacco: Never Used  Substance Use Topics  . Alcohol use: No  . Drug use: No    Family History Family History  Problem Relation Age of Onset  . Hypertension Mother   . Hypertension Sister   . Hypertension Sister   . Breast cancer Neg Hx   No family history of bleeding/clotting disorders, porphyria or autoimmune disease   Allergies  Allergen Reactions  . Meat Extract Anaphylaxis, Hives, Swelling and Other (See Comments)    Red meat. Makes her throat close and hives breakout.  Marland Kitchen Beef (Bovine) Protein   . Other   . Penicillin G   . Pork Allergy Other (See Comments)  .  Penicillins Hives     REVIEW OF SYSTEMS (Negative unless checked)  Constitutional: [] Weight loss  [] Fever  [] Chills Cardiac: [] Chest pain   [] Chest pressure   [] Palpitations   [] Shortness of breath when laying flat   [] Shortness of breath with exertion. Vascular:  [x] Pain in legs with walking   [x] Pain in legs at rest  [] History of DVT   [] Phlebitis   [] Swelling in legs   [] Varicose veins   [] Non-healing ulcers Pulmonary:   [] Uses home oxygen   [] Productive cough   [] Hemoptysis   [] Wheeze  [] COPD   [] Asthma Neurologic:  [] Dizziness   [] Seizures   [] History of stroke   [] History of TIA  [] Aphasia   [] Vissual changes   [] Weakness or numbness in arm   [] Weakness or numbness in leg Musculoskeletal:   [] Joint swelling   [x] Joint pain   [] Low back pain Hematologic:  [] Easy bruising  [] Easy bleeding   [] Hypercoagulable state   [] Anemic Gastrointestinal:  [] Diarrhea   [] Vomiting  [] Gastroesophageal reflux/heartburn   [] Difficulty swallowing. Genitourinary:  [] Chronic kidney disease   [] Difficult urination  [] Frequent urination   [] Blood in urine Skin:  [] Rashes   [] Ulcers  Psychological:  [] History of anxiety   []  History of major depression.  Physical Examination  Vitals:   07/21/20 0952  BP: (!) 160/90  Pulse: 69  Weight: 197 lb (89.4 kg)  Height: 5\' 2"  (1.575 m)   Body mass index is 36.03 kg/m. Gen: WD/WN, NAD Head: [place]/AT, No temporalis wasting.  Ear/Nose/Throat: Hearing grossly intact, nares w/o erythema or drainage, poor dentition Eyes: PER, EOMI, sclera nonicteric.  Neck: Supple, no masses.  No bruit or JVD.  Pulmonary:  Good air movement, clear to auscultation bilaterally, no use of accessory muscles.  Cardiac: RRR, normal S1, S2, no Murmurs. Vascular: both feet are warm and well perfused and normal skin Vessel Right Left  Radial Palpable Palpable  PT Palpable Palpable  DP Palpable Palpable  Gastrointestinal: soft, non-distended. No guarding/no peritoneal signs.   Musculoskeletal: M/S 5/5 throughout.  No deformity or atrophy.  Neurologic: CN 2-12 intact. Pain and light touch intact in extremities.  Symmetrical.  Speech is fluent. Motor exam as listed above. Psychiatric: Judgment intact, Mood & affect appropriate for pt's clinical situation. Dermatologic: No rashes or ulcers noted.  No changes consistent with cellulitis.  CBC Lab Results  Component Value Date   WBC 4.4 03/22/2020   HGB 12.8 03/22/2020   HCT 40.0 03/22/2020   MCV 93.7 03/22/2020   PLT 142 (L) 03/22/2020    BMET    Component Value Date/Time   NA 140 03/22/2020 1916   NA 141 12/10/2012 2055   K 4.1 03/22/2020 1916   K 4.2 12/10/2012 2055   CL 106 03/22/2020 1916   CL 107 12/10/2012 2055  CO2 27 03/22/2020 1916   CO2 28 12/10/2012 2055   GLUCOSE 98 03/22/2020 1916   GLUCOSE 103 (H) 12/10/2012 2055   BUN 25 (H) 03/22/2020 1916   BUN 24 (H) 12/10/2012 2055   CREATININE 1.01 (H) 03/22/2020 1916   CREATININE 1.14 12/10/2012 2055   CALCIUM 8.8 (L) 03/22/2020 1916   CALCIUM 8.1 (L) 12/10/2012 2055   GFRNONAA 55 (L) 03/22/2020 1916   GFRNONAA 50 (L) 12/10/2012 2055   GFRAA >60 03/22/2020 1916   GFRAA 58 (L) 12/10/2012 2055   CrCl cannot be calculated (Patient's most recent lab result is older than the maximum 21 days allowed.).  COAG Lab Results  Component Value Date   INR 1.02 03/21/2016    Radiology CUP PACEART REMOTE DEVICE CHECK  Result Date: 07/01/2020 Scheduled remote reviewed. Normal device function.  There was one short atrial arrhythmia that lasted seconds and one NSVT that appears to be supraventricular in nature Next remote 91 days. Kathy Breach, RN, CCDS, [employer]    Assessment/Plan 1. Pain of lower extremity, unspecified laterality Recommend:  I do not find evidence of Vascular pathology that would explain the patient's symptoms.  I believe her leg pain is related to her fall 3 months ago.  The patient has atypical pain symptoms for  vascular disease  I do not find evidence of Vascular pathology that would explain the patient's symptoms and I suspect the patient is c/o pseudoclaudication/orthopedic issues.  Patient should have an evaluation of his LS spine which I defer to the primary service.  The patient should continue walking and begin a more formal exercise program. The patient should continue his antiplatelet therapy and aggressive treatment of the lipid abnormalities.  Patient will follow-up with me on a PRN basis  Further work-up of her lower extremity pain is deferred to the primary service     2. Paroxysmal atrial fibrillation (HCC) Continue antiarrhythmia medications as already ordered, these medications have been reviewed and there are no changes at this time.  Continue anticoagulation as ordered by Cardiology Service     Hortencia Pilar, MD  07/21/2020 10:37 AM

## 2020-08-29 ENCOUNTER — Other Ambulatory Visit: Payer: Self-pay

## 2020-08-29 ENCOUNTER — Encounter: Payer: Self-pay | Admitting: Urology

## 2020-08-29 ENCOUNTER — Ambulatory Visit (INDEPENDENT_AMBULATORY_CARE_PROVIDER_SITE_OTHER): Payer: Medicare Other | Admitting: Urology

## 2020-08-29 VITALS — BP 164/90 | HR 59 | Ht 61.0 in | Wt 194.8 lb

## 2020-08-29 DIAGNOSIS — N3946 Mixed incontinence: Secondary | ICD-10-CM | POA: Diagnosis not present

## 2020-08-29 DIAGNOSIS — R3129 Other microscopic hematuria: Secondary | ICD-10-CM | POA: Diagnosis not present

## 2020-08-29 DIAGNOSIS — N3281 Overactive bladder: Secondary | ICD-10-CM

## 2020-08-29 LAB — BLADDER SCAN AMB NON-IMAGING

## 2020-08-29 NOTE — Progress Notes (Signed)
08/29/2020 9:48 AM   Denise [name] 07-Sep-1946 [id number]  Referring provider: [place] Pink, MD [address] [facility] [place],  [place] [address]  Chief Complaint  Patient presents with  . Over Active Bladder    HPI: I was consulted to assess the patient for incontinence and hematuria  Patient leaks with coughing and sneezing and bending and lifting if it is heavy.  She has urge incontinence.  Both are significant.  No bedwetting.  Wears 3 pads a day but does not change often and they are moderately wet  Voids every 2 hours gets up twice a night.  Reports a good flow  She has noticed a little bit of mucus in the urine most times.  Twice in the last month she had some pink when she wiped  No smoking history.  No daily aspirin or blood thinner.  No hysterectomy.  She has had 2 bladder infections treated last 6 months which is new for her.  She had a CT stone protocol April 2021 demonstrating small right renal cyst  It appears the patient had a diaphragm pessary placed for incontinence that did not help and not for vaginal bulging sensation  No kidney stone or bladder surgery.  Bowel movements normal.   PMH: Past Medical History:  Diagnosis Date  . Anxiety   . Arrhythmia    a-fib  . Depressive disorder, not elsewhere classified   . Heart disease, unspecified   . Hives   . [employer] 08/14/2012  . Paroxysmal A-fib ([place])   . Sinoatrial node dysfunction (HCC)   . Sleep apnea   . Syncope and collapse     Surgical History: Past Surgical History:  Procedure Laterality Date  . BREAST BIOPSY Left 1989  . CARDIAC CATHETERIZATION  2004   [place]  . COLONOSCOPY WITH PROPOFOL N/A 04/02/2018   Procedure: COLONOSCOPY WITH PROPOFOL;  Surgeon: Manya Silvas, MD;  Location: [facility] ENDOSCOPY;  Service: Endoscopy;  Laterality: N/A;  . EP IMPLANTABLE DEVICE N/A 03/21/2016   Procedure: PPM Generator Changeout;  Surgeon: Deboraha Sprang, MD;  Location: [place] [facility] CV LAB;  Service: Cardiovascular;  Laterality: N/A;  . INSERT / REPLACE / REMOVE PACEMAKER  10/21/2003   [place]/ [employer]    Home Medications:  Allergies as of 08/29/2020      Reactions   Meat Extract Anaphylaxis, Hives, Swelling, Other (See Comments)   Red meat. Makes her throat close and hives breakout.   Beef (bovine) Protein    Other    Penicillin G    Pork Allergy Other (See Comments)   Penicillins Hives      Medication List       Accurate as of August 29, 2020  9:48 AM. If you have any questions, ask your nurse or doctor.        cetirizine 10 MG tablet Commonly known as: ZYRTEC Take 10 mg by mouth as needed.   ciprofloxacin 250 MG tablet Commonly known as: CIPRO Take 250 mg by mouth 2 (two) times daily.   clobetasol cream 0.05 % Commonly known as: TEMOVATE Apply 1 application topically as needed (for rash).   diphenhydrAMINE 25 MG tablet Commonly known as: BENADRYL Take 25 mg by mouth every 6 (six) hours as needed for itching or allergies.   EPINEPHrine 0.3 mg/0.3 mL Soaj injection Commonly known as: EPI-PEN Inject 0.3 mg into the muscle once. Allergic to red meat.   EPINEPHrine 0.3 mg/0.3 mL Soaj injection Commonly known as:  EPI-PEN Inject into the muscle.   fluticasone 50 MCG/ACT nasal spray Commonly known as: FLONASE Place 1 spray into both nostrils as needed.   fluticasone 50 MCG/ACT nasal spray Commonly known as: FLONASE Place into the nose.   HYDROcodone-acetaminophen 5-325 MG tablet Commonly known as: NORCO/VICODIN SMARTSIG:1 Tablet(s) By Mouth 4-5 Times Daily   ibuprofen 100 MG tablet Commonly known as: ADVIL Take 100 mg by mouth every 8 (eight) hours as needed for pain or fever.   mupirocin ointment 2 % Commonly known as: BACTROBAN Apply 1 application topically as needed (for skin).   naproxen 500 MG tablet Commonly known as: Naprosyn Take 1 tablet (500 mg total) by mouth 2 (two) times daily with a meal.     nitrofurantoin (macrocrystal-monohydrate) 100 MG capsule Commonly known as: MACROBID Take 100 mg by mouth 2 (two) times daily.   triamcinolone 55 MCG/ACT Aero nasal inhaler Commonly known as: NASACORT Place 2 sprays into the nose daily as needed.       Allergies:  Allergies  Allergen Reactions  . Meat Extract Anaphylaxis, Hives, Swelling and Other (See Comments)    Red meat. Makes her throat close and hives breakout.  Marland Kitchen Beef (Bovine) Protein   . Other   . Penicillin G   . Pork Allergy Other (See Comments)  . Penicillins Hives    Family History: Family History  Problem Relation Age of Onset  . Hypertension Mother   . Hypertension Sister   . Hypertension Sister   . Breast cancer Neg Hx     Social History:  reports that she has never smoked. She has never used smokeless tobacco. She reports that she does not drink alcohol and does not use drugs.  ROS:                                        Physical Exam: There were no vitals taken for this visit.  Constitutional:  Alert and oriented, No acute distress. HEENT: [place] AT, moist mucus membranes.  Trachea midline, no masses. Cardiovascular: No clubbing, cyanosis, or edema. Respiratory: Normal respiratory effort, no increased work of breathing. GI: Abdomen is soft, nontender, nondistended, no abdominal masses GU: On pelvic examination patient had a well supported bladder neck and no stress incontinence.  She had a diaphragm pessary in the upper half of the vagina well situated.  No obvious prolapse.  The tissues beyond it near the apex looked a little bit friable but he did not see a spot of blood on the speculum Skin: No rashes, bruises or suspicious lesions. Lymph: No cervical or inguinal adenopathy. Neurologic: Grossly intact, no focal deficits, moving all 4 extremities. Psychiatric: Normal mood and affect.  Laboratory Data: Lab Results  Component Value Date   WBC 4.4 03/22/2020   HGB 12.8  03/22/2020   HCT 40.0 03/22/2020   MCV 93.7 03/22/2020   PLT 142 (L) 03/22/2020    Lab Results  Component Value Date   CREATININE 1.01 (H) 03/22/2020    No results found for: PSA  No results found for: TESTOSTERONE  No results found for: HGBA1C  Urinalysis No results found for: COLORURINE, APPEARANCEUR, LABSPEC, PHURINE, GLUCOSEU, HGBUR, BILIRUBINUR, KETONESUR, PROTEINUR, UROBILINOGEN, NITRITE, LEUKOCYTESUR  Pertinent Imaging: As noted.  Chart reviewed.  Urinalysis positive for red blood cells.  Urine sent for culture  Assessment & Plan: Picture was drawn.  Pathophysiology of incontinence discussed with future role  of urodynamics possibly needed.  Work-up for hematuria discussed.  She is going to have the pessary removed by Dr. Leonides Schanz since it did not help her incontinence.  It is never been removed.  I have asked her to make certain she gets a good thorough examination for possible spotting from the vaginal mucosa after the pessary was removed or the uterus as a source of spotting.  Her microscopic hematuria today will need a cystoscopy and CT scan with contrast.  She may need to be reexamined in the future after the pessary is removed especially if I was going to treat her stress incontinence or she develops prolapse symptoms.  I may eventually try her on medicine first before ordering urodynamics.  There are no diagnoses linked to this encounter.  No follow-ups on file.  Reece Packer, MD  [place] [address] [place], [place] [address] [phone]

## 2020-08-29 NOTE — Addendum Note (Signed)
Addended by: Alvera Novel on: 08/29/2020 11:00 AM   Modules accepted: Orders

## 2020-08-29 NOTE — Patient Instructions (Signed)
Cystoscopy Cystoscopy is a procedure that is used to help diagnose and sometimes treat conditions that affect the lower urinary tract. The lower urinary tract includes the bladder and the urethra. The urethra is the tube that drains urine from the bladder. Cystoscopy is done using a thin, tube-shaped instrument with a light and camera at the end (cystoscope). The cystoscope may be hard or flexible, depending on the goal of the procedure. The cystoscope is inserted through the urethra, into the bladder. Cystoscopy may be recommended if you have:  Urinary tract infections that keep coming back.  Blood in the urine (hematuria).  An inability to control when you urinate (urinary incontinence) or an overactive bladder.  Unusual cells found in a urine sample.  A blockage in the urethra, such as a urinary stone.  Painful urination.  An abnormality in the bladder found during an intravenous pyelogram (IVP) or CT scan. Cystoscopy may also be done to remove a sample of tissue to be examined under a microscope (biopsy). What are the risks? Generally, this is a safe procedure. However, problems may occur, including:  Infection.  Bleeding.  What happens during the procedure?  1. You will be given one or more of the following: ? A medicine to numb the area (local anesthetic). 2. The area around the opening of your urethra will be cleaned. 3. The cystoscope will be passed through your urethra into your bladder. 4. Germ-free (sterile) fluid will flow through the cystoscope to fill your bladder. The fluid will stretch your bladder so that your health care provider can clearly examine your bladder walls. 5. Your doctor will look at the urethra and bladder. 6. The cystoscope will be removed The procedure may vary among health care providers  What can I expect after the procedure? After the procedure, it is common to have: 1. Some soreness or pain in your abdomen and urethra. 2. Urinary symptoms.  These include: ? Mild pain or burning when you urinate. Pain should stop within a few minutes after you urinate. This may last for up to 1 week. ? A small amount of blood in your urine for several days. ? Feeling like you need to urinate but producing only a small amount of urine. Follow these instructions at home: General instructions  Return to your normal activities as told by your health care provider.   Do not drive for 24 hours if you were given a sedative during your procedure.  Watch for any blood in your urine. If the amount of blood in your urine increases, call your health care provider.  If a tissue sample was removed for testing (biopsy) during your procedure, it is up to you to get your test results. Ask your health care provider, or the department that is doing the test, when your results will be ready.  Drink enough fluid to keep your urine pale yellow.  Keep all follow-up visits as told by your health care provider. This is important. Contact a health care provider if you:  Have pain that gets worse or does not get better with medicine, especially pain when you urinate.  Have trouble urinating.  Have more blood in your urine. Get help right away if you:  Have blood clots in your urine.  Have abdominal pain.  Have a fever or chills.  Are unable to urinate. Summary  Cystoscopy is a procedure that is used to help diagnose and sometimes treat conditions that affect the lower urinary tract.  Cystoscopy is done using   a thin, tube-shaped instrument with a light and camera at the end.  After the procedure, it is common to have some soreness or pain in your abdomen and urethra.  Watch for any blood in your urine. If the amount of blood in your urine increases, call your health care provider.  If you were prescribed an antibiotic medicine, take it as told by your health care provider. Do not stop taking the antibiotic even if you start to feel better. This  information is not intended to replace advice given to you by your health care provider. Make sure you discuss any questions you have with your health care provider. Document Revised: 11/18/2018 Document Reviewed: 11/18/2018 Elsevier Patient Education  2020 Elsevier Inc.   

## 2020-08-29 NOTE — Addendum Note (Signed)
Addended by: Alvera Novel on: 08/29/2020 10:35 AM   Modules accepted: Orders

## 2020-08-30 LAB — URINALYSIS, COMPLETE
Bilirubin, UA: NEGATIVE
Glucose, UA: NEGATIVE
Ketones, UA: NEGATIVE
Leukocytes,UA: NEGATIVE
Nitrite, UA: NEGATIVE
Protein,UA: NEGATIVE
Specific Gravity, UA: 1.02 (ref 1.005–1.030)
Urobilinogen, Ur: 0.2 mg/dL (ref 0.2–1.0)
pH, UA: 6 (ref 5.0–7.5)

## 2020-08-30 LAB — BASIC METABOLIC PANEL
BUN/Creatinine Ratio: 26 (ref 12–28)
BUN: 24 mg/dL (ref 8–27)
CO2: 24 mmol/L (ref 20–29)
Calcium: 8.9 mg/dL (ref 8.7–10.3)
Chloride: 104 mmol/L (ref 96–106)
Creatinine, Ser: 0.94 mg/dL (ref 0.57–1.00)
GFR calc Af Amer: 69 mL/min/{1.73_m2} (ref >59)
GFR calc non Af Amer: 60 mL/min/{1.73_m2} (ref >59)
Glucose: 86 mg/dL (ref 65–99)
Potassium: 4.4 mmol/L (ref 3.5–5.2)
Sodium: 140 mmol/L (ref 134–144)

## 2020-08-30 LAB — MICROSCOPIC EXAMINATION: Bacteria, UA: NONE SEEN

## 2020-09-02 LAB — CULTURE, URINE COMPREHENSIVE

## 2020-09-19 ENCOUNTER — Ambulatory Visit (INDEPENDENT_AMBULATORY_CARE_PROVIDER_SITE_OTHER): Payer: Medicare Other | Admitting: Urology

## 2020-09-19 ENCOUNTER — Other Ambulatory Visit: Payer: Self-pay

## 2020-09-19 VITALS — BP 149/88 | HR 59 | Ht 62.0 in | Wt 194.0 lb

## 2020-09-19 DIAGNOSIS — R3129 Other microscopic hematuria: Secondary | ICD-10-CM | POA: Diagnosis not present

## 2020-09-19 DIAGNOSIS — N3946 Mixed incontinence: Secondary | ICD-10-CM | POA: Diagnosis not present

## 2020-09-19 MED ORDER — MIRABEGRON ER 50 MG PO TB24: 50.0000 mg | ORAL_TABLET | Freq: Every day | ORAL | 1 refills | Status: DC

## 2020-09-19 NOTE — Progress Notes (Signed)
09/19/2020 9:48 AM   Dawna Part 1946/03/21 [id number]  Referring provider: [place] Pink, MD [address] [facility] [place],  [place] [address]  Chief Complaint  Patient presents with  . Cysto    HPI: I was consulted to assess the patient for incontinence and hematuria  Patient leaks with coughing and sneezing and bending and lifting if it is heavy.  She has urge incontinence.  Both are significant.  No bedwetting.  Wears 3 pads a day but does not change often and they are moderately wet  Voids every 2 hours gets up twice a night.  Reports a good flow  She has noticed a little bit of mucus in the urine most times.  Twice in the last month she had some pink when she wiped  No smoking history.  No daily aspirin or blood thinner.  No hysterectomy.  She has had 2 bladder infections treated last 6 months which is new for her.  She had a CT stone protocol April 2021 demonstrating small right renal cyst  It appears the patient had a diaphragm pessary placed for incontinence that did not help and not for vaginal bulging sensation  Pelvic examination patient had a well supported bladder neck and no stress incontinence.  She had a diaphragm pessary in the upper half of the vagina well situated.  No obvious prolapse.  The tissues beyond it near the apex looked a little bit friable but he did not see a spot of blood on the speculum    Picture was drawn.  Pathophysiology of incontinence discussed with future role of urodynamics possibly needed.  Work-up for hematuria discussed.  She is going to have the pessary removed by Dr. Leonides Schanz since it did not help her incontinence.  It is never been removed.  I have asked her to make certain she gets a good thorough examination for possible spotting from the vaginal mucosa after the pessary was removed or the uterus as a source of spotting.  Her microscopic hematuria today will need a cystoscopy and CT scan with contrast.  She may need  to be reexamined in the future after the pessary is removed especially if I was going to treat her stress incontinence or she develops prolapse symptoms.  I may eventually try her on medicine first before ordering urodynamics.  Today Frequency stable I reviewed gynecology note and pessary was removed.  She had granulation tissue identified at the right posterior lateral vaginal wall cauterized.  It was felt this was likely the source of her bleeding but she could have fluid in the endometrial cavity from the ultrasound.  She has a right adnexal mass and may consider surgery in the future.  She is to follow-up in about 2 months with gynecology Has not had a hematuria CT scan last urine culture negative No more bleeding since fulguration of tissue On 12 examination she had minimal hypermobility and a small grade 1 cystocele with good vaginal length and no granulation tissue Cystoscopy: Patient underwent flexible cystoscopy utilizing sterile technique.  Bladder mucosa and trigone were normal.  No cystitis.  No carcinoma.  Trigone normal.  She tolerated the procedure well  Mixed incontinence stable Patient would like her incontinence treated but no surgery.  She also wants the CT scan with contrast.  It was ordered and I will call if abnormal.  Reassess in 6 weeks on Myrbetriq 50 mg samples and prescription    PMH: Past Medical History:  Diagnosis Date  .  Anxiety   . Arrhythmia    a-fib  . Depressive disorder, not elsewhere classified   . Heart disease, unspecified   . Hives   . [employer] 08/14/2012  . Paroxysmal A-fib ([place])   . Sinoatrial node dysfunction (HCC)   . Sleep apnea   . Syncope and collapse     Surgical History: Past Surgical History:  Procedure Laterality Date  . BREAST BIOPSY Left 1989  . CARDIAC CATHETERIZATION  2004   [place]  . COLONOSCOPY WITH PROPOFOL N/A 04/02/2018   Procedure: COLONOSCOPY WITH PROPOFOL;  Surgeon: Manya Silvas, MD;  Location:  [facility] ENDOSCOPY;  Service: Endoscopy;  Laterality: N/A;  . EP IMPLANTABLE DEVICE N/A 03/21/2016   Procedure: PPM Generator Changeout;  Surgeon: Deboraha Sprang, MD;  Location: [place] CV LAB;  Service: Cardiovascular;  Laterality: N/A;  . INSERT / REPLACE / REMOVE PACEMAKER  10/21/2003   [place]/ [employer]    Home Medications:  Allergies as of 09/19/2020      Reactions   Meat Extract Anaphylaxis, Hives, Swelling, Other (See Comments)   Red meat. Makes her throat close and hives breakout.   Beef (bovine) Protein    Other    Penicillin G    Pork Allergy Other (See Comments)   Penicillins Hives      Medication List       Accurate as of September 19, 2020  9:48 AM. If you have any questions, ask your nurse or doctor.        cetirizine 10 MG tablet Commonly known as: ZYRTEC Take 10 mg by mouth as needed.   ciprofloxacin 250 MG tablet Commonly known as: CIPRO Take 250 mg by mouth 2 (two) times daily.   clobetasol cream 0.05 % Commonly known as: TEMOVATE Apply 1 application topically as needed (for rash).   diphenhydrAMINE 25 MG tablet Commonly known as: BENADRYL Take 25 mg by mouth every 6 (six) hours as needed for itching or allergies.   EPINEPHrine 0.3 mg/0.3 mL Soaj injection Commonly known as: EPI-PEN Inject 0.3 mg into the muscle once. Allergic to red meat.   EPINEPHrine 0.3 mg/0.3 mL Soaj injection Commonly known as: EPI-PEN Inject into the muscle.   fluticasone 50 MCG/ACT nasal spray Commonly known as: FLONASE Place 1 spray into both nostrils as needed.   fluticasone 50 MCG/ACT nasal spray Commonly known as: FLONASE Place into the nose.   HYDROcodone-acetaminophen 5-325 MG tablet Commonly known as: NORCO/VICODIN SMARTSIG:1 Tablet(s) By Mouth 4-5 Times Daily   ibuprofen 100 MG tablet Commonly known as: ADVIL Take 100 mg by mouth every 8 (eight) hours as needed for pain or fever.   meloxicam 7.5 MG tablet Commonly known as: MOBIC Take 7.5 mg by  mouth 2 (two) times daily.   mupirocin ointment 2 % Commonly known as: BACTROBAN Apply 1 application topically as needed (for skin).   naproxen 500 MG tablet Commonly known as: Naprosyn Take 1 tablet (500 mg total) by mouth 2 (two) times daily with a meal.   nitrofurantoin (macrocrystal-monohydrate) 100 MG capsule Commonly known as: MACROBID Take 100 mg by mouth 2 (two) times daily.   triamcinolone 55 MCG/ACT Aero nasal inhaler Commonly known as: NASACORT Place 2 sprays into the nose daily as needed.       Allergies:  Allergies  Allergen Reactions  . Meat Extract Anaphylaxis, Hives, Swelling and Other (See Comments)    Red meat. Makes her throat close and hives breakout.  Marland Kitchen Beef (Bovine) Protein   .  Other   . Penicillin G   . Pork Allergy Other (See Comments)  . Penicillins Hives    Family History: Family History  Problem Relation Age of Onset  . Hypertension Mother   . Hypertension Sister   . Hypertension Sister   . Breast cancer Neg Hx     Social History:  reports that she has never smoked. She has never used smokeless tobacco. She reports that she does not drink alcohol and does not use drugs.  ROS:                                        Physical Exam: There were no vitals taken for this visit.  Constitutional:  Alert and oriented, No acute distress.  Laboratory Data: Lab Results  Component Value Date   WBC 4.4 03/22/2020   HGB 12.8 03/22/2020   HCT 40.0 03/22/2020   MCV 93.7 03/22/2020   PLT 142 (L) 03/22/2020    Lab Results  Component Value Date   CREATININE 0.94 08/29/2020    No results found for: PSA  No results found for: TESTOSTERONE  No results found for: HGBA1C  Urinalysis    Component Value Date/Time   APPEARANCEUR Clear 08/29/2020 0953   GLUCOSEU Negative 08/29/2020 0953   BILIRUBINUR Negative 08/29/2020 0953   PROTEINUR Negative 08/29/2020 0953   NITRITE Negative 08/29/2020 0953   LEUKOCYTESUR  Negative 08/29/2020 0953    Pertinent Imaging:   Assessment & Plan: Reassess in 6 weeks  There are no diagnoses linked to this encounter.  No follow-ups on file.  Reece Packer, MD  [place] [address] [place], [place] [address] [phone]

## 2020-09-20 LAB — URINALYSIS, COMPLETE
Bilirubin, UA: NEGATIVE
Glucose, UA: NEGATIVE
Ketones, UA: NEGATIVE
Nitrite, UA: NEGATIVE
Protein,UA: NEGATIVE
Specific Gravity, UA: 1.02 (ref 1.005–1.030)
Urobilinogen, Ur: 0.2 mg/dL (ref 0.2–1.0)
pH, UA: 7 (ref 5.0–7.5)

## 2020-09-20 LAB — MICROSCOPIC EXAMINATION: Bacteria, UA: NONE SEEN

## 2020-09-21 ENCOUNTER — Other Ambulatory Visit: Payer: Self-pay

## 2020-09-21 ENCOUNTER — Ambulatory Visit
Admission: RE | Admit: 2020-09-21 | Discharge: 2020-09-21 | Disposition: A | Discharge status: Home / Self Care | Payer: Medicare Other | Source: Ambulatory Visit | Attending: Urology | Admitting: Urology

## 2020-09-21 DIAGNOSIS — R3129 Other microscopic hematuria: Secondary | ICD-10-CM | POA: Insufficient documentation

## 2020-09-21 MED ORDER — IOHEXOL 300 MG/ML  SOLN
125.0000 mL | Freq: Once | INTRAMUSCULAR | Status: AC | PRN
  Administered 2020-09-21: 125 mL via INTRAVENOUS

## 2020-09-30 ENCOUNTER — Ambulatory Visit (INDEPENDENT_AMBULATORY_CARE_PROVIDER_SITE_OTHER): Payer: Medicare Other

## 2020-09-30 DIAGNOSIS — I442 Atrioventricular block, complete: Secondary | ICD-10-CM | POA: Diagnosis not present

## 2020-10-01 LAB — CUP PACEART REMOTE DEVICE CHECK
Battery Remaining Longevity: 126 mo
Battery Remaining Percentage: 100 %
Brady Statistic RA Percent Paced: 69 %
Brady Statistic RV Percent Paced: 0 %
Date Time Interrogation Session: 20211022045200
Implantable Lead Implant Date: 20041111
Implantable Lead Implant Date: 20041111
Implantable Lead Location: 753859
Implantable Lead Location: 753860
Implantable Lead Model: 4088
Implantable Lead Model: 4473
Implantable Lead Serial Number: 209644
Implantable Lead Serial Number: 420854
Implantable Pulse Generator Implant Date: 20170412
Lead Channel Impedance Value: 420 Ohm
Lead Channel Impedance Value: 617 Ohm
Lead Channel Pacing Threshold Amplitude: 1.4 V
Lead Channel Pacing Threshold Amplitude: 1.6 V
Lead Channel Pacing Threshold Pulse Width: 0.4 ms
Lead Channel Pacing Threshold Pulse Width: 0.4 ms
Lead Channel Setting Pacing Amplitude: 2 V
Lead Channel Setting Pacing Amplitude: 2.5 V
Lead Channel Setting Pacing Pulse Width: 0.4 ms
Lead Channel Setting Sensing Sensitivity: 2.5 mV
Pulse Gen Serial Number: 718133

## 2020-10-05 NOTE — Progress Notes (Signed)
Remote pacemaker transmission.   

## 2020-10-31 ENCOUNTER — Ambulatory Visit: Payer: Self-pay | Admitting: Urology

## 2020-11-09 NOTE — H&P (Signed)
Chief Complaint:    Patient ID: Denise [name] is a 74 y.o. female presenting with Pre-op Exam  on 09/30/2020  HPI: [name] is an established patient with a right 4.5 cm adnexal mass that was found incidentally on a CT renal stone study 03/24/20 for left flank pain.  She had some vaginal bleeding that was found to be from granulation tissue on the right vagina from her pessary.  She has had no bleeding since then.  Workup:  CA 125= 9.1, HE4: 65 (WNL) EMB: benign  TVUS:04/15/2020 Uterus antevered, displaced by hypoechoic anterior mass, 4.5 x 2.5 x 4.5cm, also with hypoechoic fluid in endometrium, not in cervix. EE: 1.64mm 73mm with total thickness with fluid 46mm RO: 3 x 2 x 2cm, 4.79ml, WNL LO: 2 x 2 x 1.5cm, 50ml, WNL   Past Medical History:  has a past medical history of Anxiety, Hives, Paroxysmal A-fib (CMS-HCC), and Sinoatrial node dysfunction (CMS-HCC).  Past Surgical History:  has a past surgical history that includes Insertion Human resources officer (2014); Colonoscopy (([place])); and Colonoscopy (04/02/2018). Family History: family history includes Cancer (age of onset: 30) in her father; Colon cancer in her maternal aunt; Colon cancer (age of onset: 85) in her maternal grandfather; Colon polyps in her maternal aunt and mother; High blood pressure (Hypertension) in her mother. Social History:  reports that she has never smoked. She has never used smokeless tobacco. She reports that she does not drink alcohol and does not use drugs. OB/GYN History:          OB History    Gravida  1   Para  1   Term  1   Preterm      AB      Living  1     SAB      IAB      Ectopic      Molar      Multiple      Live Births  1          Allergies: is allergic to beef containing products, penicillin g, penicillins, and pork derived (porcine). Medications:  Current Outpatient Medications:  .  cetirizine (ZYRTEC) 10 MG tablet, Take by mouth, Disp: , Rfl:  .   ciprofloxacin HCl (CIPRO) 250 MG tablet, Take by mouth, Disp: , Rfl:  .  diphenhydrAMINE (BENADRYL) 25 mg tablet, Take by mouth., Disp: , Rfl:  .  EPINEPHrine (EPIPEN) 0.3 mg/0.3 mL auto-injector, Inject 0.3 mg into the muscle once as needed for Anaphylaxis, Disp: , Rfl:  .  fluticasone propionate (FLONASE) 50 mcg/actuation nasal spray, PLACE 1 SPRAY INTO BOTH NOSTRILS 2 (TWO) TIMES DAILY, Disp: 16 g, Rfl: 1 .  HYDROcodone-acetaminophen (NORCO) 5-325 mg tablet, , Disp: , Rfl:  .  ibuprofen (ADVIL,MOTRIN) 100 MG tablet, Take by mouth., Disp: , Rfl:  .  mupirocin (BACTROBAN) 2 % ointment, Apply topically., Disp: , Rfl:  .  nitrofurantoin, macrocrystal-monohydrate, (MACROBID) 100 MG capsule, Take by mouth, Disp: , Rfl:    Review of Systems: No SOB, no palpitations or chest pain, no new lower extremity edema, no nausea or vomiting or bowel or bladder complaints. See HPI for gyn specific ROS.   Exam:   Constitutional: BP 143/82   Pulse 66   Wt 87.3 kg (192 lb 6.4 oz)   BMI 36.38 kg/m   WDWN female in NAD   HEENT: sclera clear, non-icteric, moist mucous membranes, dentition intact Endocrine:  no thyromegaly Respiratory: normal respiratory effort, CTABL  CV: no peripheral edema, RRR no MRG GI: soft , no mass, non-tender, no rebound tenderness  GU: tanner stage 5 ,              External genitalia/skin: vulva /labia no lesions             Lymphatic: no enlarged inguinal nodes bilaterally             Urethra: no prolapse, no diverticulum, no caruncle             Bladder: no tenderness to palpation, no cystocele             Vagina: normal physiologic d/c, no lesions             Cervix: no lesions, no cervical motion tenderness               Uterus: normal size shape and contour, non-tender, mobile             Adnexa: non-tender, no masses palpated, but exam limited by habitus  Skin: warm and well perfused, no rashes Neuro: alert, oriented x3,   Psych: appropriate mood and  insight, judgement intact     Impression:   The primary encounter diagnosis was Preoperative evaluation to rule out surgical contraindication. Diagnoses of Adnexal mass, Thickened endometrium, and Post-menopausal bleeding were also pertinent to this visit.    Plan:   Will need cardiac clearance from [facility] Dr. Charlcie Cradle.  Planned procedure: diagnostic laparoscopy, with removal of adnexal mass, bilateral salpingo-oophorectomy, and dilation and curettage with hysteroscopy.  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    The patient and I discussed the technical aspects of the procedure including the potential for risks and complications.  These include but are not limited to the risk of infection requiring post-operative antibiotics or further procedures.  We talked about the risk of injury to adjacent organs including bladder, bowel, ureter, blood vessels or nerves, the need to convert to an open incision, possible need for blood transfusion and postop complications such as thromboembolic or cardiopulmonary complications.  All of her questions were answered.  Her preoperative exam was completed.. She is scheduled to undergo this procedure in the near future.  I personally performed the service. (TP)  Orlena Garmon CRIST Suda Forbess, MD

## 2020-11-10 ENCOUNTER — Telehealth: Payer: Self-pay | Admitting: *Deleted

## 2020-11-10 ENCOUNTER — Encounter: Payer: Self-pay | Admitting: Internal Medicine

## 2020-11-10 ENCOUNTER — Other Ambulatory Visit: Payer: Self-pay | Admitting: Obstetrics & Gynecology

## 2020-11-10 NOTE — Telephone Encounter (Signed)
   [facility] Pre-operative Risk Assessment    HEARTCARE STAFF: - Please ensure there is not already an duplicate clearance open for this procedure. - Under Visit Info/Reason for Call, type in Other and utilize the format Clearance MM/DD/YY or Clearance TBD. Do not use dashes or single digits. - If request is for dental extraction, please clarify the # of teeth to be extracted.  Request for surgical clearance:  1. What type of surgery is being performed? LAPAROSCOPIC REMOVAL OF TUBES AND OVARIES   2. When is this surgery scheduled? 11/28/20   3. What type of clearance is required (medical clearance vs. Pharmacy clearance to hold med vs. Both)? MEDICAL  4. Are there any medications that need to be held prior to surgery and how long? NONE LISTED   5. Practice name and name of physician performing surgery? [facility]; DR. Vikki Ports WARD   6. What is the office phone number? [phone]   7.   What is the office fax number? [phone]  8.   Anesthesia type (None, local, MAC, general) ? WAITING ON CALL BACK TO CONFIRM TYPE OF ANESTHESIA    Julaine Hua 11/10/2020, 10:56 AM  _________________________________________________________________   (provider comments below)

## 2020-11-10 NOTE — Progress Notes (Unsigned)
[facility]   Patient Information: Name: Denise [name]  DOB: December 30, 1945  MRN: [id number]    Planned Procedure:  LAPAROSCOPIC REMOVAL OF TUBES AND OVARIES  Surgeon:  [facility]; DR. Vikki Ports WARD  Date of Procedure:  11/28/20   Device Information:   Clinic EP Physician:   Virl Axe, MD Device Type:  Pacemaker Manufacturer and Phone #:  [employer]: [phone] Pacemaker Dependent?:  No Date of Last Device Check:  10/01/20        Normal Device Function?:  Yes     Electrophysiologist's Recommendations:    Have magnet available.  Provide continuous ECG monitoring when magnet is used or reprogramming is to be performed.   Procedure may interfere with device function.  Magnet should be placed over device during procedure.  Per Device Clinic Standing Orders, [name]  11/10/2020 5:42 PM

## 2020-11-10 NOTE — Telephone Encounter (Signed)
   Primary Cardiologist: Virl Axe, MD  Chart reviewed as part of pre-operative protocol coverage. Patient was contacted 11/10/2020 in reference to pre-operative risk assessment for pending surgery as outlined below.  Jamelah Sitzer was last seen on 12/16/19 by Tommye Standard PAC.  Since that day, Hurley Blevins has done well. 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 She had an echo in Jan 2021 for reduced exertional capacity which showed normal function and no significant valvular problems.   Given her device, I will copy device clinic.  Therefore, based on ACC/AHA guidelines, the patient would be at acceptable risk for the planned procedure without further cardiovascular testing.   The patient was advised that if she develops new symptoms prior to surgery to contact our office to arrange for a follow-up visit, and she verbalized understanding.  I will route this recommendation to the requesting party via Epic fax function and remove from pre-op pool. Please call with questions.  Tami Lin Dionis Autry, PA 11/10/2020, 5:11 PM

## 2020-11-14 ENCOUNTER — Ambulatory Visit: Payer: Self-pay | Admitting: Urology

## 2020-11-21 ENCOUNTER — Other Ambulatory Visit: Payer: Medicare Other

## 2020-11-24 ENCOUNTER — Other Ambulatory Visit: Payer: Medicare Other

## 2020-11-28 ENCOUNTER — Ambulatory Visit: Admit: 2020-11-28 | Payer: Medicare Other | Admitting: Obstetrics & Gynecology

## 2020-11-28 SURGERY — LAPAROSCOPY, DIAGNOSTIC
Anesthesia: General

## 2020-12-30 ENCOUNTER — Ambulatory Visit (INDEPENDENT_AMBULATORY_CARE_PROVIDER_SITE_OTHER): Payer: Medicare HMO

## 2020-12-30 DIAGNOSIS — I442 Atrioventricular block, complete: Secondary | ICD-10-CM | POA: Diagnosis not present

## 2020-12-31 LAB — CUP PACEART REMOTE DEVICE CHECK
Battery Remaining Longevity: 132 mo
Battery Remaining Percentage: 100 %
Brady Statistic RA Percent Paced: 66 %
Brady Statistic RV Percent Paced: 0 %
Date Time Interrogation Session: 20220121170900
Implantable Lead Implant Date: 20041111
Implantable Lead Implant Date: 20041111
Implantable Lead Location: 753859
Implantable Lead Location: 753860
Implantable Lead Model: 4088
Implantable Lead Model: 4473
Implantable Lead Serial Number: 209644
Implantable Lead Serial Number: 420854
Implantable Pulse Generator Implant Date: 20170412
Lead Channel Impedance Value: 486 Ohm
Lead Channel Impedance Value: 612 Ohm
Lead Channel Pacing Threshold Amplitude: 1.3 V
Lead Channel Pacing Threshold Amplitude: 1.4 V
Lead Channel Pacing Threshold Pulse Width: 0.4 ms
Lead Channel Pacing Threshold Pulse Width: 0.4 ms
Lead Channel Setting Pacing Amplitude: 2 V
Lead Channel Setting Pacing Amplitude: 2.5 V
Lead Channel Setting Pacing Pulse Width: 0.4 ms
Lead Channel Setting Sensing Sensitivity: 2.5 mV
Pulse Gen Serial Number: 718133

## 2021-01-11 NOTE — Progress Notes (Signed)
Remote pacemaker transmission.   

## 2021-02-10 DIAGNOSIS — H524 Presbyopia: Secondary | ICD-10-CM | POA: Diagnosis not present

## 2021-03-31 ENCOUNTER — Ambulatory Visit (INDEPENDENT_AMBULATORY_CARE_PROVIDER_SITE_OTHER): Payer: Medicare HMO

## 2021-03-31 DIAGNOSIS — I442 Atrioventricular block, complete: Secondary | ICD-10-CM

## 2021-04-01 LAB — CUP PACEART REMOTE DEVICE CHECK
Battery Remaining Longevity: 120 mo
Battery Remaining Percentage: 100 %
Brady Statistic RA Percent Paced: 66 %
Brady Statistic RV Percent Paced: 0 %
Date Time Interrogation Session: 20220422045100
Implantable Lead Implant Date: 20041111
Implantable Lead Implant Date: 20041111
Implantable Lead Location: 753859
Implantable Lead Location: 753860
Implantable Lead Model: 4088
Implantable Lead Model: 4473
Implantable Lead Serial Number: 209644
Implantable Lead Serial Number: 420854
Implantable Pulse Generator Implant Date: 20170412
Lead Channel Impedance Value: 485 Ohm
Lead Channel Impedance Value: 624 Ohm
Lead Channel Pacing Threshold Amplitude: 1.2 V
Lead Channel Pacing Threshold Amplitude: 1.4 V
Lead Channel Pacing Threshold Pulse Width: 0.4 ms
Lead Channel Pacing Threshold Pulse Width: 0.4 ms
Lead Channel Setting Pacing Amplitude: 2 V
Lead Channel Setting Pacing Amplitude: 2.5 V
Lead Channel Setting Pacing Pulse Width: 0.4 ms
Lead Channel Setting Sensing Sensitivity: 2.5 mV
Pulse Gen Serial Number: 718133

## 2021-04-14 DIAGNOSIS — Z03818 Encounter for observation for suspected exposure to other biological agents ruled out: Secondary | ICD-10-CM | POA: Diagnosis not present

## 2021-04-14 DIAGNOSIS — R0982 Postnasal drip: Secondary | ICD-10-CM | POA: Diagnosis not present

## 2021-04-14 DIAGNOSIS — R0981 Nasal congestion: Secondary | ICD-10-CM | POA: Diagnosis not present

## 2021-04-14 DIAGNOSIS — R030 Elevated blood-pressure reading, without diagnosis of hypertension: Secondary | ICD-10-CM | POA: Diagnosis not present

## 2021-04-14 DIAGNOSIS — J3489 Other specified disorders of nose and nasal sinuses: Secondary | ICD-10-CM | POA: Diagnosis not present

## 2021-04-18 NOTE — Progress Notes (Signed)
Remote pacemaker transmission.   

## 2021-06-27 DIAGNOSIS — G4733 Obstructive sleep apnea (adult) (pediatric): Secondary | ICD-10-CM | POA: Diagnosis not present

## 2021-06-30 ENCOUNTER — Ambulatory Visit (INDEPENDENT_AMBULATORY_CARE_PROVIDER_SITE_OTHER): Payer: Medicare HMO

## 2021-06-30 DIAGNOSIS — I442 Atrioventricular block, complete: Secondary | ICD-10-CM

## 2021-07-03 LAB — CUP PACEART REMOTE DEVICE CHECK
Battery Remaining Longevity: 120 mo
Battery Remaining Percentage: 100 %
Brady Statistic RA Percent Paced: 66 %
Brady Statistic RV Percent Paced: 0 %
Date Time Interrogation Session: 20220722094000
Implantable Lead Implant Date: 20041111
Implantable Lead Implant Date: 20041111
Implantable Lead Location: 753859
Implantable Lead Location: 753860
Implantable Lead Model: 4088
Implantable Lead Model: 4473
Implantable Lead Serial Number: 209644
Implantable Lead Serial Number: 420854
Implantable Pulse Generator Implant Date: 20170412
Lead Channel Impedance Value: 484 Ohm
Lead Channel Impedance Value: 637 Ohm
Lead Channel Pacing Threshold Amplitude: 1.3 V
Lead Channel Pacing Threshold Amplitude: 1.4 V
Lead Channel Pacing Threshold Pulse Width: 0.4 ms
Lead Channel Pacing Threshold Pulse Width: 0.4 ms
Lead Channel Setting Pacing Amplitude: 2 V
Lead Channel Setting Pacing Amplitude: 2.5 V
Lead Channel Setting Pacing Pulse Width: 0.4 ms
Lead Channel Setting Sensing Sensitivity: 2.5 mV
Pulse Gen Serial Number: 718133

## 2021-07-25 NOTE — Progress Notes (Signed)
Remote pacemaker transmission.   

## 2021-08-07 IMAGING — CT CT HEAD W/O CM
3 series · 15 of 44 positions shown, 18 images · non-contrast
Comparison: 10/13/2013

CLINICAL DATA: Left arm pain, weakness

EXAM:
CT HEAD WITHOUT CONTRAST
TECHNIQUE: Contiguous axial images were obtained from the base of the skull
through the vertex without intravenous contrast.

[Series 2: head 5.0 h37s · axial · 0.39mm/px · z∈[+148,+258]mm · 9 of 27 slices shown, 12 images]
[im 3/27  brain]
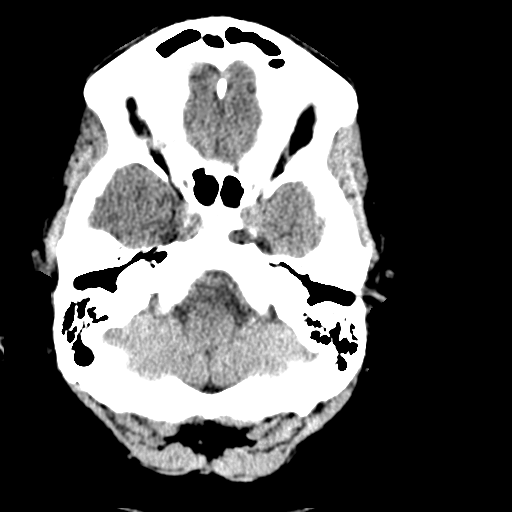
[im 3/27  bone]
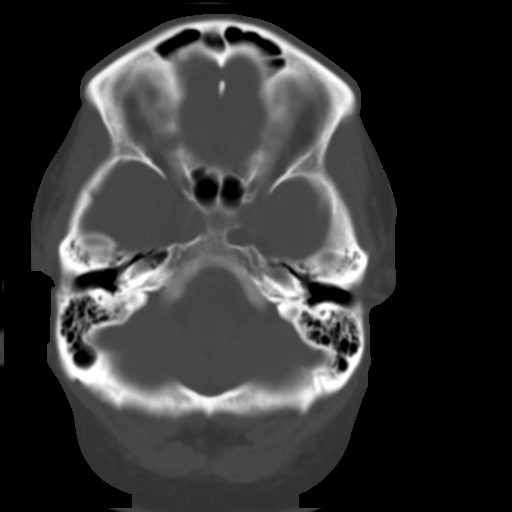
[im 6/27  brain]
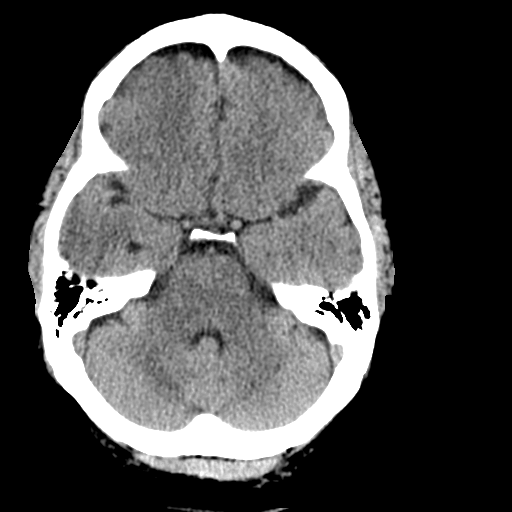
[im 8/27  brain]
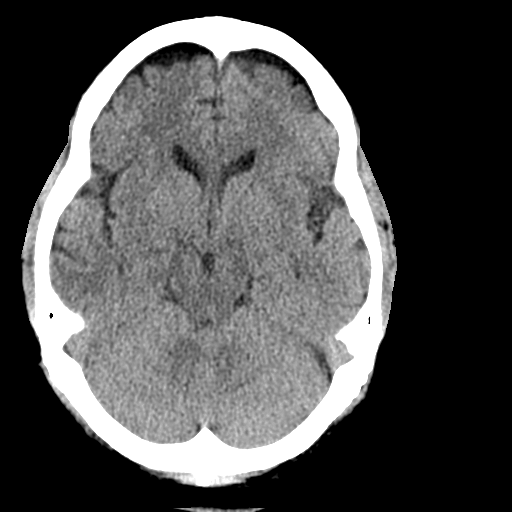
[im 11/27  brain]
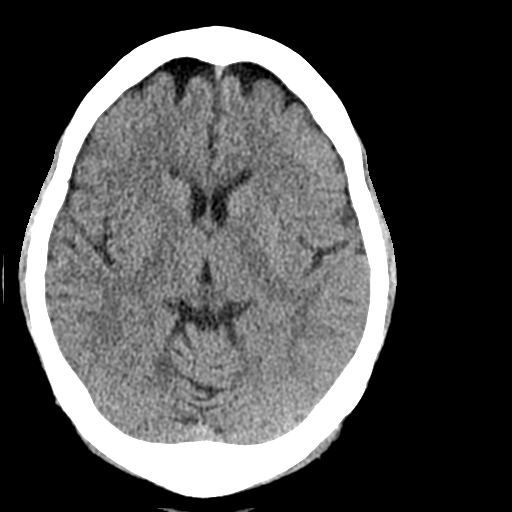
[im 14/27  brain]
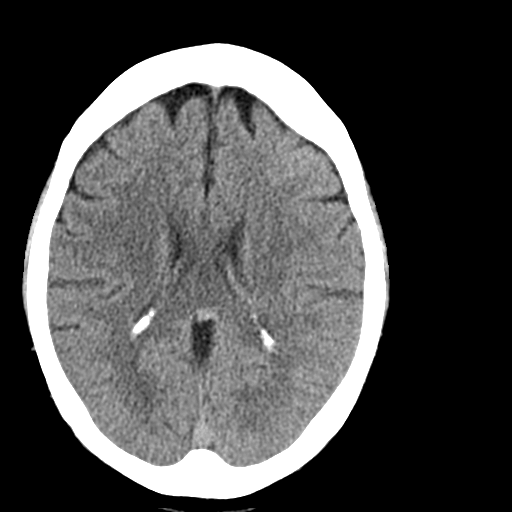
[im 14/27  bone]
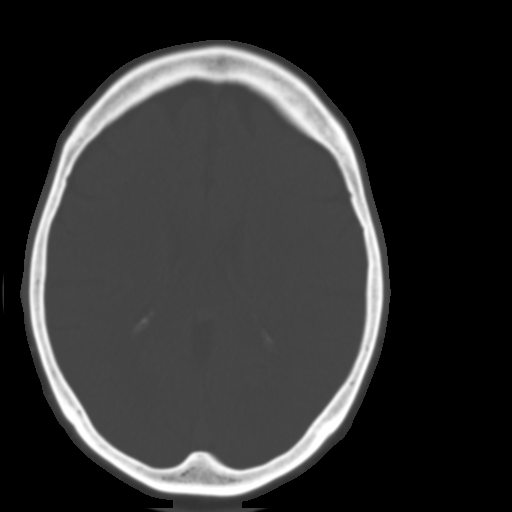
[im 17/27  brain]
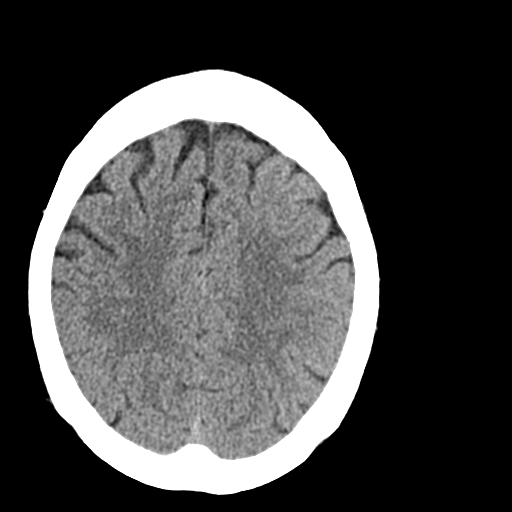
[im 20/27  brain]
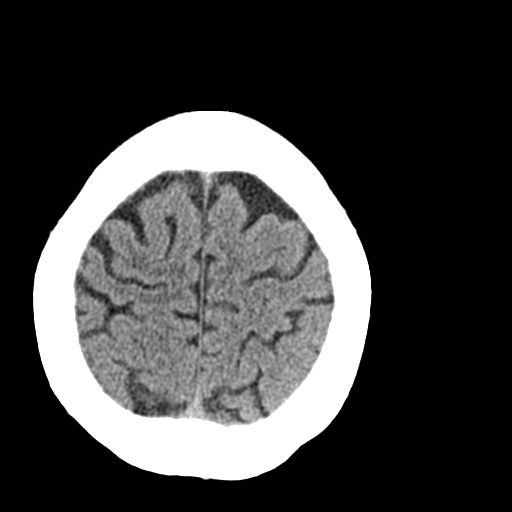
[im 22/27  brain]
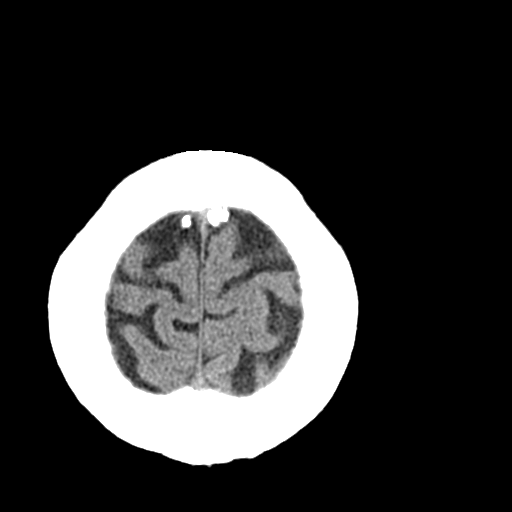
[im 25/27  brain]
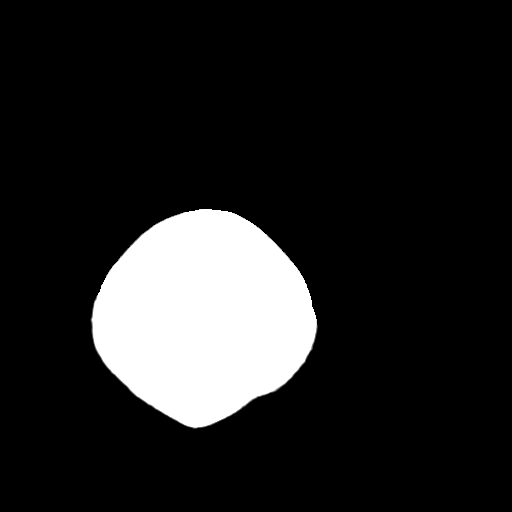
[im 25/27  bone]
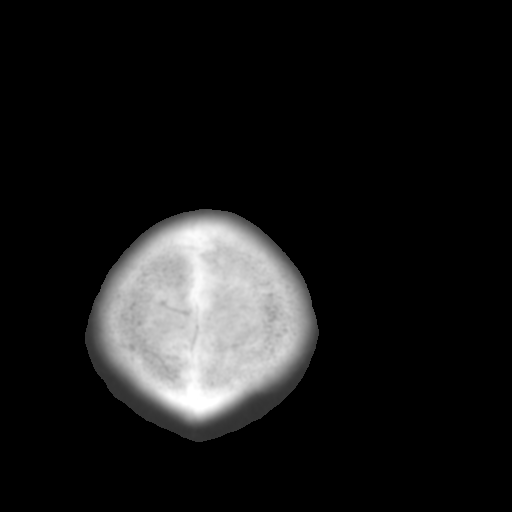

[Series 4: head 3.0 mpr cor · coronal · 0.27mm/px · 3 of 67 slices shown]
[im 23/67  brain]
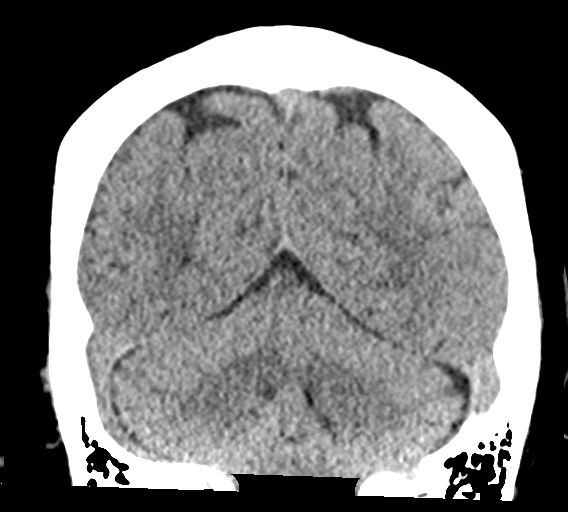
[im 30/67  brain]
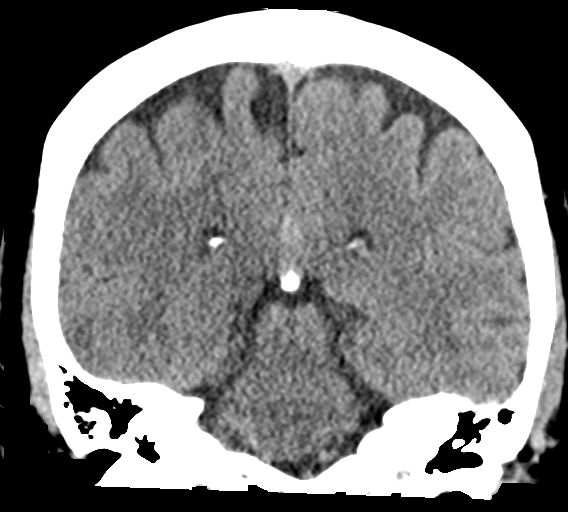
[im 37/67  brain]
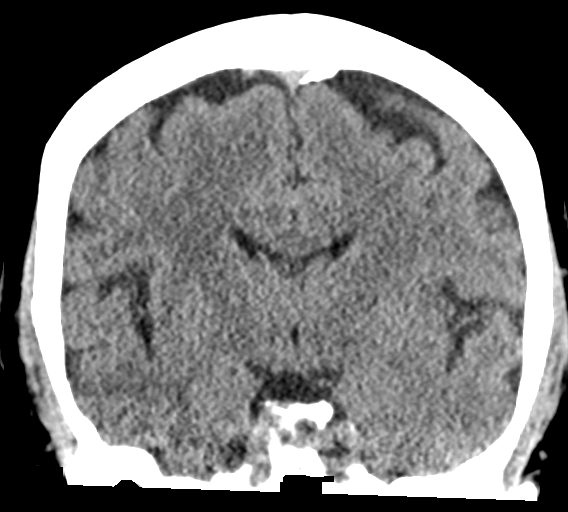

[Series 5: head 3.0 mpr sag · sagittal · 0.25mm/px · 3 of 52 slices shown]
[im 18/52  brain]
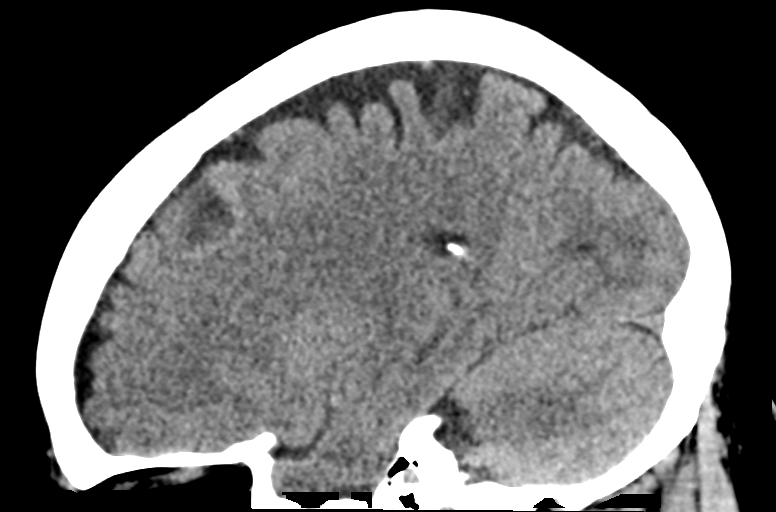
[im 26/52  brain]
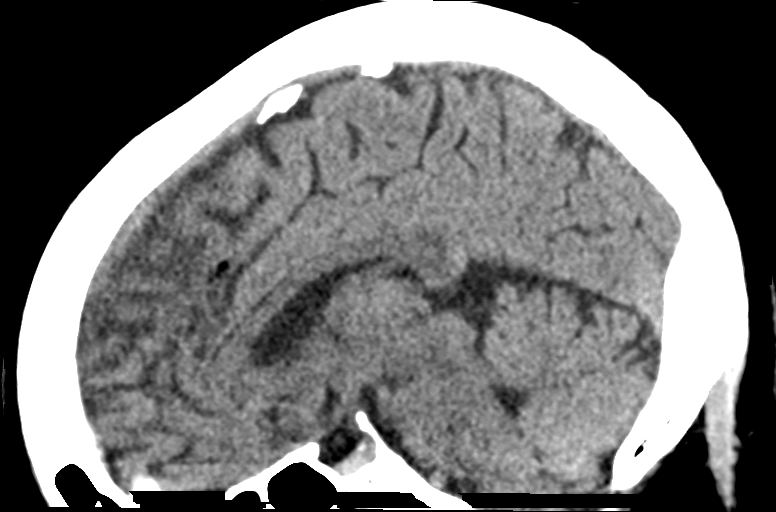
[im 35/52  brain]
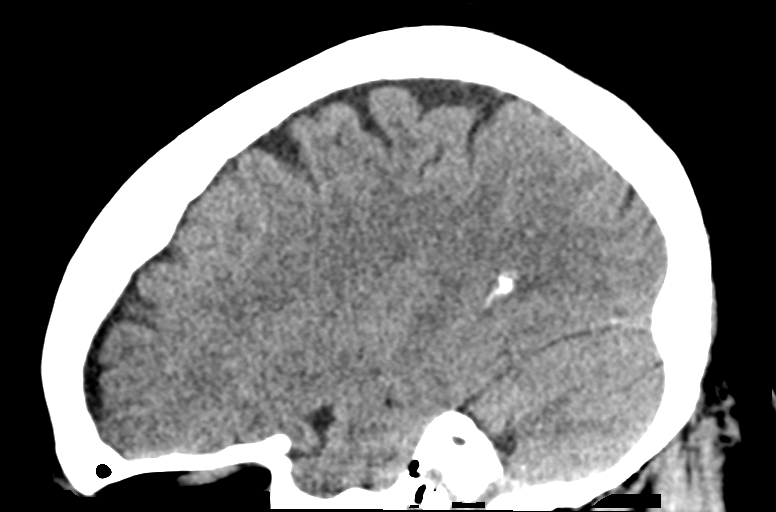

[15 of 44 positions shown; findings below may reference images not displayed]

FINDINGS: Brain: No acute intracranial abnormality. Specifically, no
hemorrhage, hydrocephalus, mass lesion, acute infarction, or
significant intracranial injury.

Vascular: No hyperdense vessel or unexpected calcification.

Skull: No acute calvarial abnormality.

Sinuses/Orbits: Visualized paranasal sinuses and mastoids clear.
Orbital soft tissues unremarkable.

Other: None
IMPRESSION: No acute intracranial abnormality.

## 2021-08-28 DIAGNOSIS — D696 Thrombocytopenia, unspecified: Secondary | ICD-10-CM | POA: Diagnosis not present

## 2021-08-28 DIAGNOSIS — M25561 Pain in right knee: Secondary | ICD-10-CM | POA: Diagnosis not present

## 2021-08-28 DIAGNOSIS — I495 Sick sinus syndrome: Secondary | ICD-10-CM | POA: Diagnosis not present

## 2021-08-28 DIAGNOSIS — M25532 Pain in left wrist: Secondary | ICD-10-CM | POA: Diagnosis not present

## 2021-08-28 DIAGNOSIS — M79671 Pain in right foot: Secondary | ICD-10-CM | POA: Diagnosis not present

## 2021-08-28 DIAGNOSIS — M25571 Pain in right ankle and joints of right foot: Secondary | ICD-10-CM | POA: Diagnosis not present

## 2021-08-28 DIAGNOSIS — Z0000 Encounter for general adult medical examination without abnormal findings: Secondary | ICD-10-CM | POA: Diagnosis not present

## 2021-08-28 DIAGNOSIS — M79661 Pain in right lower leg: Secondary | ICD-10-CM | POA: Diagnosis not present

## 2021-08-30 DIAGNOSIS — I480 Paroxysmal atrial fibrillation: Secondary | ICD-10-CM | POA: Diagnosis not present

## 2021-08-30 DIAGNOSIS — R7303 Prediabetes: Secondary | ICD-10-CM | POA: Diagnosis not present

## 2021-08-30 DIAGNOSIS — M25561 Pain in right knee: Secondary | ICD-10-CM | POA: Diagnosis not present

## 2021-08-30 DIAGNOSIS — M79661 Pain in right lower leg: Secondary | ICD-10-CM | POA: Diagnosis not present

## 2021-08-30 DIAGNOSIS — M25532 Pain in left wrist: Secondary | ICD-10-CM | POA: Diagnosis not present

## 2021-08-30 DIAGNOSIS — Z0000 Encounter for general adult medical examination without abnormal findings: Secondary | ICD-10-CM | POA: Diagnosis not present

## 2021-08-30 DIAGNOSIS — D696 Thrombocytopenia, unspecified: Secondary | ICD-10-CM | POA: Diagnosis not present

## 2021-08-30 DIAGNOSIS — M25571 Pain in right ankle and joints of right foot: Secondary | ICD-10-CM | POA: Diagnosis not present

## 2021-08-30 DIAGNOSIS — M79671 Pain in right foot: Secondary | ICD-10-CM | POA: Diagnosis not present

## 2021-08-31 ENCOUNTER — Other Ambulatory Visit: Payer: Self-pay | Admitting: Family Medicine

## 2021-08-31 DIAGNOSIS — Z1231 Encounter for screening mammogram for malignant neoplasm of breast: Secondary | ICD-10-CM

## 2021-09-05 DIAGNOSIS — L298 Other pruritus: Secondary | ICD-10-CM | POA: Diagnosis not present

## 2021-09-21 ENCOUNTER — Ambulatory Visit
Admission: RE | Admit: 2021-09-21 | Discharge: 2021-09-21 | Disposition: A | Discharge status: Home / Self Care | Payer: Medicare HMO | Source: Ambulatory Visit | Attending: Family Medicine | Admitting: Family Medicine

## 2021-09-21 ENCOUNTER — Other Ambulatory Visit: Payer: Self-pay

## 2021-09-21 DIAGNOSIS — Z1231 Encounter for screening mammogram for malignant neoplasm of breast: Secondary | ICD-10-CM | POA: Diagnosis not present

## 2021-09-29 ENCOUNTER — Ambulatory Visit (INDEPENDENT_AMBULATORY_CARE_PROVIDER_SITE_OTHER): Payer: Medicare HMO

## 2021-09-29 DIAGNOSIS — I442 Atrioventricular block, complete: Secondary | ICD-10-CM | POA: Diagnosis not present

## 2021-10-01 LAB — CUP PACEART REMOTE DEVICE CHECK
Battery Remaining Longevity: 120 mo
Battery Remaining Percentage: 100 %
Brady Statistic RA Percent Paced: 66 %
Brady Statistic RV Percent Paced: 0 %
Date Time Interrogation Session: 20221021045100
Implantable Lead Implant Date: 20041111
Implantable Lead Implant Date: 20041111
Implantable Lead Location: 753859
Implantable Lead Location: 753860
Implantable Lead Model: 4088
Implantable Lead Model: 4473
Implantable Lead Serial Number: 209644
Implantable Lead Serial Number: 420854
Implantable Pulse Generator Implant Date: 20170412
Lead Channel Impedance Value: 519 Ohm
Lead Channel Impedance Value: 653 Ohm
Lead Channel Pacing Threshold Amplitude: 1.2 V
Lead Channel Pacing Threshold Amplitude: 1.3 V
Lead Channel Pacing Threshold Pulse Width: 0.4 ms
Lead Channel Pacing Threshold Pulse Width: 0.4 ms
Lead Channel Setting Pacing Amplitude: 2 V
Lead Channel Setting Pacing Amplitude: 2.5 V
Lead Channel Setting Pacing Pulse Width: 0.4 ms
Lead Channel Setting Sensing Sensitivity: 2.5 mV
Pulse Gen Serial Number: 718133

## 2021-10-03 DIAGNOSIS — G4733 Obstructive sleep apnea (adult) (pediatric): Secondary | ICD-10-CM | POA: Diagnosis not present

## 2021-10-09 NOTE — Progress Notes (Signed)
Remote pacemaker transmission.   

## 2021-11-21 DIAGNOSIS — Z0100 Encounter for examination of eyes and vision without abnormal findings: Secondary | ICD-10-CM | POA: Diagnosis not present

## 2021-12-01 DIAGNOSIS — I10 Essential (primary) hypertension: Secondary | ICD-10-CM | POA: Diagnosis not present

## 2021-12-25 DIAGNOSIS — M79604 Pain in right leg: Secondary | ICD-10-CM | POA: Diagnosis not present

## 2021-12-25 DIAGNOSIS — R7303 Prediabetes: Secondary | ICD-10-CM | POA: Diagnosis not present

## 2021-12-25 DIAGNOSIS — I10 Essential (primary) hypertension: Secondary | ICD-10-CM | POA: Diagnosis not present

## 2021-12-29 ENCOUNTER — Ambulatory Visit (INDEPENDENT_AMBULATORY_CARE_PROVIDER_SITE_OTHER): Payer: Medicare HMO

## 2021-12-29 DIAGNOSIS — I442 Atrioventricular block, complete: Secondary | ICD-10-CM

## 2021-12-29 LAB — CUP PACEART REMOTE DEVICE CHECK
Battery Remaining Longevity: 120 mo
Battery Remaining Percentage: 100 %
Brady Statistic RA Percent Paced: 67 %
Brady Statistic RV Percent Paced: 0 %
Date Time Interrogation Session: 20230120045200
Implantable Lead Implant Date: 20041111
Implantable Lead Implant Date: 20041111
Implantable Lead Location: 753859
Implantable Lead Location: 753860
Implantable Lead Model: 4088
Implantable Lead Model: 4473
Implantable Lead Serial Number: 209644
Implantable Lead Serial Number: 420854
Implantable Pulse Generator Implant Date: 20170412
Lead Channel Impedance Value: 549 Ohm
Lead Channel Impedance Value: 630 Ohm
Lead Channel Pacing Threshold Amplitude: 1.2 V
Lead Channel Pacing Threshold Amplitude: 1.4 V
Lead Channel Pacing Threshold Pulse Width: 0.4 ms
Lead Channel Pacing Threshold Pulse Width: 0.4 ms
Lead Channel Setting Pacing Amplitude: 2 V
Lead Channel Setting Pacing Amplitude: 2.5 V
Lead Channel Setting Pacing Pulse Width: 0.4 ms
Lead Channel Setting Sensing Sensitivity: 2.5 mV
Pulse Gen Serial Number: 718133

## 2022-01-10 NOTE — Progress Notes (Signed)
Remote pacemaker transmission.   

## 2022-01-31 ENCOUNTER — Other Ambulatory Visit (INDEPENDENT_AMBULATORY_CARE_PROVIDER_SITE_OTHER): Payer: Self-pay | Admitting: Nurse Practitioner

## 2022-01-31 ENCOUNTER — Ambulatory Visit (INDEPENDENT_AMBULATORY_CARE_PROVIDER_SITE_OTHER): Payer: Medicare HMO

## 2022-01-31 ENCOUNTER — Ambulatory Visit (INDEPENDENT_AMBULATORY_CARE_PROVIDER_SITE_OTHER): Payer: Medicare HMO | Admitting: Nurse Practitioner

## 2022-01-31 ENCOUNTER — Encounter (INDEPENDENT_AMBULATORY_CARE_PROVIDER_SITE_OTHER): Payer: Self-pay | Admitting: Nurse Practitioner

## 2022-01-31 ENCOUNTER — Other Ambulatory Visit: Payer: Self-pay

## 2022-01-31 VITALS — BP 170/94 | HR 60 | Resp 16 | Ht 64.0 in | Wt 198.8 lb

## 2022-01-31 DIAGNOSIS — M79606 Pain in leg, unspecified: Secondary | ICD-10-CM

## 2022-01-31 DIAGNOSIS — M79604 Pain in right leg: Secondary | ICD-10-CM | POA: Diagnosis not present

## 2022-02-11 ENCOUNTER — Encounter (INDEPENDENT_AMBULATORY_CARE_PROVIDER_SITE_OTHER): Payer: Self-pay | Admitting: Nurse Practitioner

## 2022-02-11 NOTE — Progress Notes (Signed)
Subjective:    Patient ID: Denise [name], female    DOB: 09-01-1946, 76 y.o.   MRN: [id number] Chief Complaint  Patient presents with   New Patient (Initial Visit)    Ref Kary Kos right leg pain    Denise [name] is a 76 year old female that presents today with right leg pain and she notes that it hurts whether or not she is walking or at rest.  She notes the pain goes from her shin to her toes.  This pain tends to come and go.  She notes that approximately a little over a year ago she fell and ended up hitting the shin area of her right leg.  She was unable to get up and it was very very painful that she was on crutches for some time following this injury.  The patient presents to [place] because she recently had an x-ray done to evaluate for the cause of pain and it was noted that she has some plaque seen on x-ray.  Today the patient has an ABI of 1.18 on the right and 1.11 on the left.  She has triphasic tibial artery waveforms bilaterally with good toe waveforms bilaterally.   Review of Systems  Musculoskeletal:  Positive for myalgias.  All other systems reviewed and are negative.     Objective:   Physical Exam Vitals reviewed.  HENT:     Head: Normocephalic.  Cardiovascular:     Rate and Rhythm: Normal rate.     Pulses: Normal pulses.  Pulmonary:     Effort: Pulmonary effort is normal.  Neurological:     Mental Status: She is alert and oriented to person, place, and time.  Psychiatric:        Mood and Affect: Mood normal.        Behavior: Behavior normal.        Thought Content: Thought content normal.        Judgment: Judgment normal.    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    Paroxysmal A-fib (HCC)    Sinoatrial node dysfunction (HCC)    Sleep apnea    Syncope and collapse     Social History   Socioeconomic History   Marital status: Married    Spouse name: Not on file   Number of children: Not on file   Years of education: Not on file   Highest education level: Not on file  Occupational History   Not on file  Tobacco Use   Smoking status: Never   Smokeless tobacco: Never  Substance and Sexual Activity   Alcohol use: No   Drug use: No   Sexual activity: Not on file  Other Topics Concern   Not on file  Social History Narrative   Not on file   Social Determinants of Health   Financial Resource Strain: Not on file  Food Insecurity: Not on file  Transportation Needs: Not on file  Physical Activity: Not on file  Stress: Not on file  Social Connections: Not on file  Intimate Partner Violence: Not  on file    Past Surgical History:  Procedure Laterality Date   BREAST BIOPSY Left 1989   CARDIAC CATHETERIZATION  2004   [place]   COLONOSCOPY WITH PROPOFOL N/A 04/02/2018   Procedure: COLONOSCOPY WITH PROPOFOL;  Surgeon: Manya Silvas, MD;  Location: [facility] ENDOSCOPY;  Service: Endoscopy;  Laterality: N/A;   EP IMPLANTABLE DEVICE N/A 03/21/2016   Procedure: PPM Generator Changeout;  Surgeon: Deboraha Sprang, MD;  Location: [facility] CV LAB;  Service: Cardiovascular;  Laterality: N/A;   INSERT / REPLACE / REMOVE PACEMAKER  10/21/2003   [place]/ [employer]    Family History  Problem Relation Age of Onset   Hypertension Mother    Hypertension Sister    Hypertension Sister    Breast cancer Neg Hx     Allergies  Allergen Reactions   Meat Extract Anaphylaxis, Hives, Swelling and Other (See Comments)    Red meat. Makes her throat close and hives breakout.   Beef (Bovine) Protein    Other    Penicillin G    Pork Allergy Other (See Comments)   Penicillins Hives    CBC Latest Ref Rng & Units 03/22/2020 03/21/2016 12/10/2012  WBC 4.0 - 10.5  K/uL 4.4 2.8(L) 3.0(L)  Hemoglobin 12.0 - 15.0 g/dL 12.8 13.0 12.7  Hematocrit 36.0 - 46.0 % 40.0 40.5 39.2  Platelets 150 - 400 K/uL 142(L) 132(L) 135(L)      CMP     Component Value Date/Time   NA 140 08/29/2020 1052   NA 141 12/10/2012 2055   K 4.4 08/29/2020 1052   K 4.2 12/10/2012 2055   CL 104 08/29/2020 1052   CL 107 12/10/2012 2055   CO2 24 08/29/2020 1052   CO2 28 12/10/2012 2055   GLUCOSE 86 08/29/2020 1052   GLUCOSE 98 03/22/2020 1916   GLUCOSE 103 (H) 12/10/2012 2055   BUN 24 08/29/2020 1052   BUN 24 (H) 12/10/2012 2055   CREATININE 0.94 08/29/2020 1052   CREATININE 1.14 12/10/2012 2055   CALCIUM 8.9 08/29/2020 1052   CALCIUM 8.1 (L) 12/10/2012 2055   GFRNONAA 60 08/29/2020 1052   GFRNONAA 50 (L) 12/10/2012 2055   GFRAA 69 08/29/2020 1052   GFRAA 58 (L) 12/10/2012 2055     VAS [place] ABI WITH/WO TBI  Result Date: 02/05/2022  [place] STUDY Patient Name:  Denise [name]  Date of Exam:   01/31/2022 Medical Rec #: [id number]        Accession #:    [id number] Date of Birth: 13-Oct-1946         Patient Gender: F Patient Age:   64 years Exam Location:  [facility] Procedure:      VAS [place] ABI WITH/WO TBI Referring Phys: Eulogio Ditch --------------------------------------------------------------------------------  Indications: Plaque seen on XRay.  Performing Technologist: Almira Coaster RVS  Examination Guidelines: A complete evaluation includes at minimum, Doppler waveform signals and systolic blood pressure reading at the level of bilateral brachial, anterior tibial, and posterior tibial arteries, when vessel segments are accessible. Bilateral testing is considered an integral part of a complete examination. Photoelectric Plethysmograph (PPG) waveforms and toe systolic pressure readings are included as required and additional duplex testing as needed. Limited examinations for reoccurring indications may be performed as noted.  ABI Findings:  +---------+------------------+-----+---------+--------+  Right     Rt Pressure (mmHg) Index Waveform  Comment   +---------+------------------+-----+---------+--------+  Brachial  166                                          +---------+------------------+-----+---------+--------+  ATA       187                1.01  triphasic           +---------+------------------+-----+---------+--------+  PTA       220                1.18  triphasic           +---------+------------------+-----+---------+--------+  Great Toe 190                1.02  Normal              +---------+------------------+-----+---------+--------+ +---------+------------------+-----+---------+-------+  Left      Lt Pressure (mmHg) Index Waveform  Comment  +---------+------------------+-----+---------+-------+  Brachial  186                                         +---------+------------------+-----+---------+-------+  ATA       191                1.03  triphasic          +---------+------------------+-----+---------+-------+  PTA       207                1.11  triphasic          +---------+------------------+-----+---------+-------+  Great Toe 206                1.11  Normal             +---------+------------------+-----+---------+-------+ +-------+-----------+-----------+------------+------------+  ABI/TBI Today's ABI Today's TBI Previous ABI Previous TBI  +-------+-----------+-----------+------------+------------+  Right   1.18        1.02                                   +-------+-----------+-----------+------------+------------+  Left    1.11        1.11                                   +-------+-----------+-----------+------------+------------+  Summary: Right: Resting right ankle-brachial index is within normal range. No evidence of significant right lower extremity arterial disease. The right toe-brachial index is normal. Left: Resting left ankle-brachial index is within normal range. No evidence of significant left lower extremity arterial  disease. The left toe-brachial index is normal.  *See table(s) above for measurements and observations.  Electronically signed by Leotis Pain MD on 02/05/2022 at 12:15:48 PM.    Final        Assessment & Plan:   1. Pain of lower extremity, unspecified laterality Today the patient's ABIs do not indicate any significant vascular abnormalities.  They are normal today.  This is consistent with the previous studies the patient had in 2021.  The patient's lower extremity pain is likely result lingering pain following her traumatic fall.  Patient is advised to continue to follow-up with PCP.  She will follow-up with [place] on an as-needed basis.   Current Outpatient Medications on File Prior to Visit  Medication Sig Dispense Refill   amLODipine (NORVASC) 2.5 MG tablet Take 2.5 mg by mouth daily.     EPINEPHrine 0.3 mg/0.3 mL IJ SOAJ injection Inject into the muscle.     fluticasone (FLONASE) 50  MCG/ACT nasal spray Place 1 spray into both nostrils as needed.     cetirizine (ZYRTEC) 10 MG tablet Take 10 mg by mouth as needed. (Patient not taking: Reported on 01/31/2022)     ciprofloxacin (CIPRO) 250 MG tablet Take 250 mg by mouth 2 (two) times daily. (Patient not taking: Reported on 09/19/2020)     clobetasol cream (TEMOVATE) 6.26 % Apply 1 application topically as needed (for rash).  (Patient not taking: Reported on 01/31/2022)     diphenhydrAMINE (BENADRYL) 25 MG tablet Take 25 mg by mouth every 6 (six) hours as needed for itching or allergies. (Patient not taking: Reported on 01/31/2022)     EPINEPHrine 0.3 mg/0.3 mL IJ SOAJ injection Inject 0.3 mg into the muscle once. Allergic to red meat. (Patient not taking: Reported on 01/31/2022)     fluticasone (FLONASE) 50 MCG/ACT nasal spray Place into the nose.     HYDROcodone-acetaminophen (NORCO/VICODIN) 5-325 MG tablet SMARTSIG:1 Tablet(s) By Mouth 4-5 Times Daily (Patient not taking: No sig reported)     ibuprofen (ADVIL,MOTRIN) 100 MG tablet Take 100 mg by mouth  every 8 (eight) hours as needed for pain or fever. (Patient not taking: Reported on 01/31/2022)     meloxicam (MOBIC) 7.5 MG tablet Take 7.5 mg by mouth 2 (two) times daily. (Patient not taking: Reported on 01/31/2022)     mirabegron ER (MYRBETRIQ) 50 MG TB24 tablet Take 1 tablet (50 mg total) by mouth daily. (Patient not taking: Reported on 01/31/2022) 30 tablet 1   mupirocin ointment (BACTROBAN) 2 % Apply 1 application topically as needed (for skin).  (Patient not taking: Reported on 01/31/2022)     naproxen (NAPROSYN) 500 MG tablet Take 1 tablet (500 mg total) by mouth 2 (two) times daily with a meal. (Patient not taking: Reported on 09/19/2020) 20 tablet 2   nitrofurantoin, macrocrystal-monohydrate, (MACROBID) 100 MG capsule Take 100 mg by mouth 2 (two) times daily. (Patient not taking: Reported on 09/19/2020)     triamcinolone (NASACORT) 55 MCG/ACT AERO nasal inhaler Place 2 sprays into the nose daily as needed. (Patient not taking: Reported on 01/31/2022)     No current facility-administered medications on file prior to visit.    There are no Patient Instructions on file for this visit. No follow-ups on file.   Kris Hartmann, NP

## 2022-02-15 DIAGNOSIS — M79641 Pain in right hand: Secondary | ICD-10-CM | POA: Diagnosis not present

## 2022-02-15 DIAGNOSIS — M79642 Pain in left hand: Secondary | ICD-10-CM | POA: Diagnosis not present

## 2022-03-08 ENCOUNTER — Telehealth: Payer: Self-pay

## 2022-03-08 NOTE — Telephone Encounter (Signed)
Remote transmission reviewed. Normal device function. Patient denies any redness, swelling or drainage at incision site. Patient advised to follow up with PCP for further evaluation. Agreeable to plan.   ?

## 2022-03-08 NOTE — Telephone Encounter (Signed)
Patient called in stating she has left shoulder pain for the past week. Patient states it has been off and on. Patient will send a remote transmission and I have let her know a nurse will give her a call back to go over transmission. Patient verbalized understanding  ?

## 2022-03-12 DIAGNOSIS — H6121 Impacted cerumen, right ear: Secondary | ICD-10-CM | POA: Diagnosis not present

## 2022-03-12 DIAGNOSIS — Z950 Presence of cardiac pacemaker: Secondary | ICD-10-CM | POA: Diagnosis not present

## 2022-03-12 DIAGNOSIS — I480 Paroxysmal atrial fibrillation: Secondary | ICD-10-CM | POA: Diagnosis not present

## 2022-03-12 DIAGNOSIS — I10 Essential (primary) hypertension: Secondary | ICD-10-CM | POA: Diagnosis not present

## 2022-03-26 DIAGNOSIS — M19049 Primary osteoarthritis, unspecified hand: Secondary | ICD-10-CM | POA: Diagnosis not present

## 2022-03-26 DIAGNOSIS — I480 Paroxysmal atrial fibrillation: Secondary | ICD-10-CM | POA: Diagnosis not present

## 2022-03-26 DIAGNOSIS — R1310 Dysphagia, unspecified: Secondary | ICD-10-CM | POA: Diagnosis not present

## 2022-03-26 DIAGNOSIS — R7303 Prediabetes: Secondary | ICD-10-CM | POA: Diagnosis not present

## 2022-03-26 DIAGNOSIS — I10 Essential (primary) hypertension: Secondary | ICD-10-CM | POA: Diagnosis not present

## 2022-03-27 ENCOUNTER — Other Ambulatory Visit: Payer: Self-pay | Admitting: Family Medicine

## 2022-03-27 DIAGNOSIS — R1310 Dysphagia, unspecified: Secondary | ICD-10-CM

## 2022-03-30 ENCOUNTER — Ambulatory Visit (INDEPENDENT_AMBULATORY_CARE_PROVIDER_SITE_OTHER): Payer: Medicare HMO

## 2022-03-30 DIAGNOSIS — I442 Atrioventricular block, complete: Secondary | ICD-10-CM | POA: Diagnosis not present

## 2022-03-30 LAB — CUP PACEART REMOTE DEVICE CHECK
Battery Remaining Longevity: 114 mo
Battery Remaining Percentage: 100 %
Brady Statistic RA Percent Paced: 67 %
Brady Statistic RV Percent Paced: 0 %
Date Time Interrogation Session: 20230421045100
Implantable Lead Implant Date: 20041111
Implantable Lead Implant Date: 20041111
Implantable Lead Location: 753859
Implantable Lead Location: 753860
Implantable Lead Model: 4088
Implantable Lead Model: 4473
Implantable Lead Serial Number: 209644
Implantable Lead Serial Number: 420854
Implantable Pulse Generator Implant Date: 20170412
Lead Channel Impedance Value: 529 Ohm
Lead Channel Impedance Value: 627 Ohm
Lead Channel Pacing Threshold Amplitude: 1.4 V
Lead Channel Pacing Threshold Amplitude: 1.4 V
Lead Channel Pacing Threshold Pulse Width: 0.4 ms
Lead Channel Pacing Threshold Pulse Width: 0.4 ms
Lead Channel Setting Pacing Amplitude: 2 V
Lead Channel Setting Pacing Amplitude: 2.5 V
Lead Channel Setting Pacing Pulse Width: 0.4 ms
Lead Channel Setting Sensing Sensitivity: 2.5 mV
Pulse Gen Serial Number: 718133

## 2022-04-03 ENCOUNTER — Ambulatory Visit
Admission: RE | Admit: 2022-04-03 | Discharge: 2022-04-03 | Disposition: A | Discharge status: Home / Self Care | Payer: Medicare HMO | Source: Ambulatory Visit | Attending: Family Medicine | Admitting: Family Medicine

## 2022-04-03 DIAGNOSIS — R1310 Dysphagia, unspecified: Secondary | ICD-10-CM | POA: Diagnosis not present

## 2022-04-03 DIAGNOSIS — K222 Esophageal obstruction: Secondary | ICD-10-CM | POA: Diagnosis not present

## 2022-04-03 DIAGNOSIS — K219 Gastro-esophageal reflux disease without esophagitis: Secondary | ICD-10-CM | POA: Diagnosis not present

## 2022-04-16 NOTE — Progress Notes (Signed)
Remote pacemaker transmission.   

## 2022-05-28 ENCOUNTER — Ambulatory Visit: Payer: Medicare HMO | Admitting: Gastroenterology

## 2022-06-29 ENCOUNTER — Ambulatory Visit (INDEPENDENT_AMBULATORY_CARE_PROVIDER_SITE_OTHER): Payer: Medicare HMO

## 2022-06-29 DIAGNOSIS — I442 Atrioventricular block, complete: Secondary | ICD-10-CM

## 2022-06-29 LAB — CUP PACEART REMOTE DEVICE CHECK
Battery Remaining Longevity: 102 mo
Battery Remaining Percentage: 100 %
Brady Statistic RA Percent Paced: 67 %
Brady Statistic RV Percent Paced: 0 %
Date Time Interrogation Session: 20230721085900
Implantable Lead Implant Date: 20041111
Implantable Lead Implant Date: 20041111
Implantable Lead Location: 753859
Implantable Lead Location: 753860
Implantable Lead Model: 4088
Implantable Lead Model: 4473
Implantable Lead Serial Number: 209644
Implantable Lead Serial Number: 420854
Implantable Pulse Generator Implant Date: 20170412
Lead Channel Impedance Value: 468 Ohm
Lead Channel Impedance Value: 611 Ohm
Lead Channel Pacing Threshold Amplitude: 1.1 V
Lead Channel Pacing Threshold Amplitude: 1.4 V
Lead Channel Pacing Threshold Pulse Width: 0.4 ms
Lead Channel Pacing Threshold Pulse Width: 0.4 ms
Lead Channel Setting Pacing Amplitude: 2 V
Lead Channel Setting Pacing Amplitude: 2.5 V
Lead Channel Setting Pacing Pulse Width: 0.4 ms
Lead Channel Setting Sensing Sensitivity: 2.5 mV
Pulse Gen Serial Number: 718133

## 2022-07-13 DIAGNOSIS — M79622 Pain in left upper arm: Secondary | ICD-10-CM | POA: Diagnosis not present

## 2022-07-13 DIAGNOSIS — Z950 Presence of cardiac pacemaker: Secondary | ICD-10-CM | POA: Diagnosis not present

## 2022-07-13 DIAGNOSIS — Z880 Allergy status to penicillin: Secondary | ICD-10-CM | POA: Diagnosis not present

## 2022-07-13 DIAGNOSIS — R42 Dizziness and giddiness: Secondary | ICD-10-CM | POA: Diagnosis not present

## 2022-07-13 DIAGNOSIS — R5381 Other malaise: Secondary | ICD-10-CM | POA: Diagnosis not present

## 2022-07-13 DIAGNOSIS — M79602 Pain in left arm: Secondary | ICD-10-CM | POA: Diagnosis not present

## 2022-07-16 NOTE — Progress Notes (Signed)
Remote pacemaker transmission.   

## 2022-08-20 NOTE — Progress Notes (Unsigned)
Electrophysiology Office Note Date: 08/21/2022  ID:  [name], [name] 29-Mar-1946, MRN [id number]  PCP: Maryland Pink, MD Primary Cardiologist: Virl Axe, MD Electrophysiologist: Virl Axe, MD   CC: Pacemaker follow-up  Denise [name] is a 76 y.o. female seen today for Virl Axe, MD for routine electrophysiology followup. Last seen in clinic 12/16/2019.   Since last being seen in our clinic the patient reports doing well overall. Her BP has been running high. She previously stopped her amlodipine because her BP was "OK".  she denies chest pain, palpitations, dyspnea, PND, orthopnea, nausea, vomiting, dizziness, syncope, edema, weight gain, or early satiety.  She additionally asks about drooling from the R side of her mouth. No focal neurologic symptoms. Occurs as she does not wear her dentures, which are uncomfortable.   Device History: BSCi dual chamber PPM, s/p gen change 03/21/16, initial implant 2004  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/2012   Paroxysmal A-fib (HCC)    Sinoatrial node dysfunction (HCC)    Sleep apnea    Syncope and collapse    Past Surgical History:  Procedure Laterality Date   BREAST BIOPSY Left 1989   CARDIAC CATHETERIZATION  2004   [place]   COLONOSCOPY WITH PROPOFOL N/A 04/02/2018   Procedure: COLONOSCOPY WITH PROPOFOL;  Surgeon: Manya Silvas, MD;  Location: [facility] ENDOSCOPY;  Service: Endoscopy;  Laterality: N/A;   EP IMPLANTABLE DEVICE N/A 03/21/2016   Procedure: PPM Generator Changeout;  Surgeon: Deboraha Sprang, MD;  Location: [place] CV LAB;  Service: Cardiovascular;  Laterality: N/A;   INSERT / REPLACE / REMOVE PACEMAKER  10/21/2003   [place]/ [employer]    Current Outpatient Medications  Medication Sig Dispense Refill   amLODipine (NORVASC) 2.5 MG tablet Take 2.5 mg  by mouth daily.     clobetasol cream (TEMOVATE) 9.32 % Apply 1 application  topically as needed (for rash).     diphenhydrAMINE (BENADRYL) 25 MG tablet Take 25 mg by mouth every 6 (six) hours as needed for itching or allergies.     EPINEPHrine 0.3 mg/0.3 mL IJ SOAJ injection Inject 0.3 mg into the muscle once. Allergic to red meat.     EPINEPHrine 0.3 mg/0.3 mL IJ SOAJ injection Inject into the muscle.     fluticasone (FLONASE) 50 MCG/ACT nasal spray Place 1 spray into both nostrils as needed.     ibuprofen (ADVIL,MOTRIN) 100 MG tablet Take 100 mg by mouth every 8 (eight) hours as needed for pain or fever.     mupirocin ointment (BACTROBAN) 2 % Apply 1 application  topically as needed (for skin).     cetirizine (ZYRTEC) 10 MG tablet Take 10 mg by mouth as needed. (Patient not taking: Reported on 01/31/2022)     fluticasone (FLONASE) 50 MCG/ACT nasal spray Place into the nose.     triamcinolone (NASACORT) 55 MCG/ACT AERO nasal inhaler Place 2 sprays into the nose daily as needed. (Patient not taking: Reported on 01/31/2022)     No current facility-administered medications for this visit.    Allergies:   Meat extract, Beef (bovine) protein, Other, Penicillin g, Pork allergy, and Penicillins   Social History: Social History   Socioeconomic History   Marital status: Married    Spouse name: Not on file   Number of children:  Not on file   Years of education: Not on file   Highest education level: Not on file  Occupational History   Not on file  Tobacco Use   Smoking status: Never   Smokeless tobacco: Never  Substance and Sexual Activity   Alcohol use: No   Drug use: No   Sexual activity: Not on file  Other Topics Concern   Not on file  Social History Narrative   Not on file   Social Determinants of Health   Financial Resource Strain: Not on file  Food Insecurity: Not on file  Transportation Needs: Not on file  Physical Activity: Not on file  Stress: Not on file  Social  Connections: Not on file  Intimate Partner Violence: Not on file    Family History: Family History  Problem Relation Age of Onset   Hypertension Mother    Hypertension Sister    Hypertension Sister    Breast cancer Neg Hx      Review of Systems: All other systems reviewed and are otherwise negative except as noted above.  Physical Exam: Vitals:   08/21/22 1138  BP: (!) 140/90  Pulse: 60  SpO2: 98%  Weight: 191 lb 6.4 oz (86.8 kg)  Height: '5\' 3"'$  (1.6 m)     GEN- The patient is well appearing, alert and oriented x 3 today.   HEENT: normocephalic, atraumatic; sclera clear, conjunctiva pink; hearing intact; oropharynx clear; neck supple, no JVP Lymph- no cervical lymphadenopathy Lungs- Clear to ausculation bilaterally, normal work of breathing.  No wheezes, rales, rhonchi Heart- Regular  rate and rhythm, no murmurs, rubs or gallops, PMI not laterally displaced GI- soft, non-tender, non-distended, bowel sounds present, no hepatosplenomegaly Extremities- no clubbing or cyanosis. No peripheral edema; DP/PT/radial pulses 2+ bilaterally MS- no significant deformity or atrophy Skin- warm and dry, no rash or lesion; PPM pocket well healed Psych- euthymic mood, full affect Neuro- strength and sensation are intact  PPM Interrogation-  reviewed in detail today,  See PACEART report.  EKG:  EKG is ordered today. Personal review of ekg ordered today shows AP-VS at 60 bpm   Recent Labs: No results found for requested labs within last 365 days.   Wt Readings from Last 3 Encounters:  08/21/22 191 lb 6.4 oz (86.8 kg)  01/31/22 198 lb 12.8 oz (90.2 kg)  09/19/20 194 lb (88 kg)     Other studies Reviewed: Additional studies/ records that were reviewed today include: Previous EP office notes, Previous remote checks, Most recent labwork.   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 PW and amplitude increased to maintain 2:1 safety margin. She is NOT dependent. Threshold similar bipolar/unipolar.   2. HTN She is non-compliant with her amlodipine. Encouraged daily use and PCP follow up if remains elevated.  Repeat 132/84  3. Drooling 4. Poorly fitting dentures Have encouraged dental follow up.  Nothing further to offer from cardiac perspective.   Current medicines are reviewed at length with the patient today.    Labs/ tests ordered today include:  Orders Placed This Encounter  Procedures   EKG 12-Lead     Disposition:   Follow up with Dr. Caryl Comes in 12 months    Signed, Shirley Friar, PA-C  08/21/2022 11:45 AM  [place] [address] [place] [address] [phone] (office) [phone] (fax)

## 2022-08-21 ENCOUNTER — Ambulatory Visit: Payer: Medicare HMO | Attending: Student | Admitting: Student

## 2022-08-21 ENCOUNTER — Encounter: Payer: Self-pay | Admitting: Student

## 2022-08-21 VITALS — BP 140/90 | HR 60 | Ht 63.0 in | Wt 191.4 lb

## 2022-08-21 DIAGNOSIS — I10 Essential (primary) hypertension: Secondary | ICD-10-CM

## 2022-08-21 DIAGNOSIS — K0889 Other specified disorders of teeth and supporting structures: Secondary | ICD-10-CM | POA: Diagnosis not present

## 2022-08-21 DIAGNOSIS — Z972 Presence of dental prosthetic device (complete) (partial): Secondary | ICD-10-CM

## 2022-08-21 DIAGNOSIS — I442 Atrioventricular block, complete: Secondary | ICD-10-CM | POA: Diagnosis not present

## 2022-08-21 MED ORDER — AMLODIPINE BESYLATE 2.5 MG PO TABS: 2.5000 mg | ORAL_TABLET | Freq: Every day | ORAL | 3 refills | Status: DC

## 2022-08-21 NOTE — Patient Instructions (Signed)
Medication Instructions:  Your physician recommends that you continue on your current medications as directed. Please refer to the Current Medication list given to you today.  *If you need a refill on your cardiac medications before your next appointment, please call your pharmacy*   Lab Work: None If you have labs (blood work) drawn today and your tests are completely normal, you will receive your results only by: [place] (if you have MyChart) OR A paper copy in the mail If you have any lab test that is abnormal or we need to change your treatment, we will call you to review the results.   Follow-Up: At [facility], you and your health needs are our priority.  As part of our continuing mission to provide you with exceptional heart care, we have created designated Provider Care Teams.  These Care Teams include your primary Cardiologist (physician) and Advanced Practice Providers (APPs -  Physician Assistants and Nurse Practitioners) who all work together to provide you with the care you need, when you need it.   Your next appointment:   1 year(s)  The format for your next appointment:   In Person  Provider:   Virl Axe, MD

## 2022-08-22 LAB — CUP PACEART INCLINIC DEVICE CHECK
Date Time Interrogation Session: 20230912000000
Implantable Lead Implant Date: 20041111
Implantable Lead Implant Date: 20041111
Implantable Lead Location: 753859
Implantable Lead Location: 753860
Implantable Lead Model: 4088
Implantable Lead Model: 4473
Implantable Lead Serial Number: 209644
Implantable Lead Serial Number: 420854
Implantable Pulse Generator Implant Date: 20170412
Lead Channel Impedance Value: 482 Ohm
Lead Channel Impedance Value: 624 Ohm
Lead Channel Pacing Threshold Amplitude: 1 V
Lead Channel Pacing Threshold Amplitude: 1.2 V
Lead Channel Pacing Threshold Pulse Width: 0.4 ms
Lead Channel Pacing Threshold Pulse Width: 1 ms
Lead Channel Sensing Intrinsic Amplitude: 20.6 mV
Lead Channel Sensing Intrinsic Amplitude: 3.5 mV
Lead Channel Setting Pacing Amplitude: 2.5 V
Lead Channel Setting Pacing Amplitude: 2.5 V
Lead Channel Setting Pacing Pulse Width: 0.4 ms
Lead Channel Setting Sensing Sensitivity: 2.5 mV
Pulse Gen Serial Number: 718133

## 2022-09-24 DIAGNOSIS — I10 Essential (primary) hypertension: Secondary | ICD-10-CM | POA: Diagnosis not present

## 2022-09-24 DIAGNOSIS — R7303 Prediabetes: Secondary | ICD-10-CM | POA: Diagnosis not present

## 2022-09-24 DIAGNOSIS — M19049 Primary osteoarthritis, unspecified hand: Secondary | ICD-10-CM | POA: Diagnosis not present

## 2022-09-28 ENCOUNTER — Ambulatory Visit (INDEPENDENT_AMBULATORY_CARE_PROVIDER_SITE_OTHER): Payer: Medicare HMO

## 2022-09-28 DIAGNOSIS — I442 Atrioventricular block, complete: Secondary | ICD-10-CM

## 2022-09-28 LAB — CUP PACEART REMOTE DEVICE CHECK
Battery Remaining Longevity: 102 mo
Battery Remaining Percentage: 100 %
Brady Statistic RA Percent Paced: 64 %
Brady Statistic RV Percent Paced: 0 %
Date Time Interrogation Session: 20231020045200
Implantable Lead Implant Date: 20041111
Implantable Lead Implant Date: 20041111
Implantable Lead Location: 753859
Implantable Lead Location: 753860
Implantable Lead Model: 4088
Implantable Lead Model: 4473
Implantable Lead Serial Number: 209644
Implantable Lead Serial Number: 420854
Implantable Pulse Generator Implant Date: 20170412
Lead Channel Impedance Value: 491 Ohm
Lead Channel Impedance Value: 615 Ohm
Lead Channel Pacing Threshold Amplitude: 1.1 V
Lead Channel Pacing Threshold Amplitude: 1.4 V
Lead Channel Pacing Threshold Pulse Width: 0.4 ms
Lead Channel Pacing Threshold Pulse Width: 0.4 ms
Lead Channel Setting Pacing Amplitude: 2.5 V
Lead Channel Setting Pacing Amplitude: 2.5 V
Lead Channel Setting Pacing Pulse Width: 0.4 ms
Lead Channel Setting Sensing Sensitivity: 2.5 mV
Pulse Gen Serial Number: 718133

## 2022-10-01 DIAGNOSIS — Z8261 Family history of arthritis: Secondary | ICD-10-CM | POA: Diagnosis not present

## 2022-10-01 DIAGNOSIS — Z1331 Encounter for screening for depression: Secondary | ICD-10-CM | POA: Diagnosis not present

## 2022-10-01 DIAGNOSIS — G4733 Obstructive sleep apnea (adult) (pediatric): Secondary | ICD-10-CM | POA: Diagnosis not present

## 2022-10-01 DIAGNOSIS — M79641 Pain in right hand: Secondary | ICD-10-CM | POA: Diagnosis not present

## 2022-10-01 DIAGNOSIS — I10 Essential (primary) hypertension: Secondary | ICD-10-CM | POA: Diagnosis not present

## 2022-10-01 DIAGNOSIS — Z889 Allergy status to unspecified drugs, medicaments and biological substances status: Secondary | ICD-10-CM | POA: Diagnosis not present

## 2022-10-01 DIAGNOSIS — Z0000 Encounter for general adult medical examination without abnormal findings: Secondary | ICD-10-CM | POA: Diagnosis not present

## 2022-10-01 DIAGNOSIS — J309 Allergic rhinitis, unspecified: Secondary | ICD-10-CM | POA: Diagnosis not present

## 2022-10-04 NOTE — Progress Notes (Signed)
Remote pacemaker transmission.   

## 2022-10-08 ENCOUNTER — Encounter (INDEPENDENT_AMBULATORY_CARE_PROVIDER_SITE_OTHER): Payer: Self-pay

## 2022-10-11 ENCOUNTER — Other Ambulatory Visit: Payer: Self-pay | Admitting: Family Medicine

## 2022-10-11 DIAGNOSIS — Z1231 Encounter for screening mammogram for malignant neoplasm of breast: Secondary | ICD-10-CM

## 2022-10-15 ENCOUNTER — Ambulatory Visit (INDEPENDENT_AMBULATORY_CARE_PROVIDER_SITE_OTHER): Payer: Medicare HMO | Admitting: Gastroenterology

## 2022-10-15 ENCOUNTER — Other Ambulatory Visit: Payer: Self-pay

## 2022-10-15 ENCOUNTER — Encounter: Payer: Self-pay | Admitting: Gastroenterology

## 2022-10-15 VITALS — BP 157/86 | HR 63 | Temp 98.4°F | Ht 63.0 in | Wt 191.0 lb

## 2022-10-15 DIAGNOSIS — R1310 Dysphagia, unspecified: Secondary | ICD-10-CM | POA: Diagnosis not present

## 2022-10-15 DIAGNOSIS — R1319 Other dysphagia: Secondary | ICD-10-CM

## 2022-10-15 NOTE — Progress Notes (Unsigned)
Denise Darby, MD [address]  [place]  [place], [place] [address]  Main: [phone]  Fax: [phone]    Gastroenterology Consultation  Referring Provider:     Maryland Pink, MD Primary Care Physician:  Maryland Pink, MD Primary Gastroenterologist:  Dr. Cephas [name] Reason for Consultation: Difficulty swallowing solids        HPI:   Kaneshia Cater is a 76 y.o. female referred by Dr. Maryland Pink, MD  for consultation & management of chronic symptoms of difficulty swallowing solids.  Patient reports that whenever she has any solid food she feels like it gets stuck before it goes down.  This has been ongoing for several months.  She missed her previous appointment with me because she was busy taking care of her sister.  She had x-ray esophagus on 04/03/2022 which revealed as follows IMPRESSION: No mass or stricture.   Relative narrowing at the gastroesophageal junction contributing to mild dysmotility, which was symptomatic. Consider endoscopy/manometry.   Spontaneous gastroesophageal reflux.   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  Patient denies any weight loss, heartburn, regurgitation, nausea or vomiting She is not on any long-term PPI  NSAIDs: None  Antiplts/Anticoagulants/Anti thrombotics: None  GI Procedures:  Colonoscopy 04/02/2018 - One diminutive polyp in the distal descending colon, removed with a jumbo cold forceps. Resected and retrieved. - Diverticulosis in the sigmoid colon and in the descending colon. - Internal hemorrhoids.  DIAGNOSIS:  A. COLON POLYP, DESCENDING; COLD BIOPSY:  - TUBULAR ADENOMA.  - NEGATIVE FOR HIGH-GRADE DYSPLASIA AND MALIGNANCY.   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/2012   Paroxysmal A-fib (HCC)     Sinoatrial node dysfunction (HCC)    Sleep apnea    Syncope and collapse     Past Surgical History:  Procedure Laterality Date   BREAST BIOPSY Left 1989   CARDIAC CATHETERIZATION  2004   [place]   COLONOSCOPY WITH PROPOFOL N/A 04/02/2018   Procedure: COLONOSCOPY WITH PROPOFOL;  Surgeon: Manya Silvas, MD;  Location[facility];  Service: Endoscopy;  Laterality: N/A;   EP IMPLANTABLE DEVICE N/A 03/21/2016   Procedure: PPM Generator Changeout;  Surgeon: Deboraha Sprang, MD;  Location: [place] CV LAB;  Service: Cardiovascular;  Laterality: N/A;   INSERT / REPLACE / REMOVE PACEMAKER  10/21/2003   [place]/ [employer]     Current Outpatient Medications:    amLODipine (NORVASC) 2.5 MG tablet, Take 1 tablet (2.5 mg total) by mouth daily., Disp: 90 tablet, Rfl: 3   cetirizine (ZYRTEC) 10 MG tablet, Take 10 mg by mouth as needed., Disp: , Rfl:    clobetasol cream (TEMOVATE) 5.68 %, Apply 1 application  topically as needed (for rash)., Disp: , Rfl:    diphenhydrAMINE (BENADRYL) 25 MG tablet, Take 25 mg by mouth every 6 (six) hours as needed for itching or allergies., Disp: , Rfl:    EPINEPHrine 0.3 mg/0.3 mL IJ SOAJ injection, Inject 0.3 mg into the muscle once. Allergic to red meat., Disp: , Rfl:    fluticasone (FLONASE) 50 MCG/ACT nasal spray, Place 1 spray into both nostrils as needed., Disp: , Rfl:    ibuprofen (ADVIL,MOTRIN) 100 MG tablet, Take 100 mg by mouth every 8 (eight) hours as needed for pain or  fever., Disp: , Rfl:    mupirocin ointment (BACTROBAN) 2 %, Apply 1 application  topically as needed (for skin)., Disp: , Rfl:    triamcinolone (NASACORT) 55 MCG/ACT AERO nasal inhaler, Place 2 sprays into the nose daily as needed., Disp: , Rfl:    Family History  Problem Relation Age of Onset   Hypertension Mother    Hypertension Sister    Hypertension Sister    Breast cancer Neg Hx      Social History   Tobacco Use   Smoking status: Never   Smokeless tobacco: Never   Vaping Use   Vaping Use: Never used  Substance Use Topics   Alcohol use: No   Drug use: No    Allergies as of 10/15/2022 - Review Complete 10/15/2022  Allergen Reaction Noted   Meat extract Anaphylaxis, Hives, Swelling, and Other (See Comments) 02/23/2016   Beef (bovine) protein  01/21/2019   Other  01/21/2019   Penicillin g  01/21/2019   Pork allergy Other (See Comments) 01/21/2019   Penicillins Hives 08/06/2012    Review of Systems:    All systems reviewed and negative except where noted in HPI.   Physical Exam:  BP (!) 157/86 (BP Location: Right Arm, Patient Position: Sitting, Cuff Size: Normal)   Pulse 63   Temp 98.4 F (36.9 C) (Oral)   Ht '5\' 3"'$  (1.6 m)   Wt 191 lb (86.6 kg)   BMI 33.83 kg/m  No LMP recorded. Patient is postmenopausal.  General:   Alert,  Well-developed, well-nourished, pleasant and cooperative in NAD Head:  Normocephalic and atraumatic. Eyes:  Sclera clear, no icterus.   Conjunctiva pink. Ears:  Normal auditory acuity. Nose:  No deformity, discharge, or lesions. Mouth:  No deformity or lesions,oropharynx pink & moist. Neck:  Supple; no masses or thyromegaly. Lungs:  Respirations even and unlabored.  Clear throughout to auscultation.   No wheezes, crackles, or rhonchi. No acute distress. Heart:  Regular rate and rhythm; no murmurs, clicks, rubs, or gallops. Abdomen:  Normal bowel sounds. Soft, non-tender and non-distended without masses, hepatosplenomegaly or hernias noted.  No guarding or rebound tenderness.   Rectal: Not performed Msk:  Symmetrical without gross deformities. Good, equal movement & strength bilaterally. Pulses:  Normal pulses noted. Extremities:  No clubbing or edema.  No cyanosis. Neurologic:  Alert and oriented x3;  grossly normal neurologically. Skin:  Intact without significant lesions or rashes. No jaundice. Psych:  Alert and cooperative. Normal mood and affect.  Imaging Studies: Reviewed  Assessment and Plan:    Rosaelena Kemnitz is a 76 y.o. female with history of A-fib, sinus node dysfunction, s/p pacemaker is seen in consultation for more than 6 months history of dysphagia to solids.  X-ray esophagus revealed possible narrowing of the esophagus Recommend EGD for further evaluation Discussed with patient to avoid hard meats until EGD is performed   Follow up based on the upper endoscopy results   Denise Darby, MD

## 2022-10-16 ENCOUNTER — Encounter: Payer: Self-pay | Admitting: Gastroenterology

## 2022-10-17 ENCOUNTER — Ambulatory Visit: Payer: Medicare HMO

## 2022-10-17 ENCOUNTER — Encounter: Payer: Self-pay | Admitting: Gastroenterology

## 2022-10-17 ENCOUNTER — Encounter
Admission: RE | Disposition: A | Discharge status: Home / Self Care | Payer: Self-pay | Source: Ambulatory Visit | Attending: Gastroenterology

## 2022-10-17 ENCOUNTER — Ambulatory Visit
Admission: RE | Admit: 2022-10-17 | Discharge: 2022-10-17 | Disposition: A | Discharge status: Home / Self Care | Payer: Medicare HMO | Source: Ambulatory Visit | Attending: Gastroenterology | Admitting: Gastroenterology

## 2022-10-17 DIAGNOSIS — R1319 Other dysphagia: Secondary | ICD-10-CM

## 2022-10-17 DIAGNOSIS — R1314 Dysphagia, pharyngoesophageal phase: Secondary | ICD-10-CM | POA: Diagnosis not present

## 2022-10-17 DIAGNOSIS — K2100 Gastro-esophageal reflux disease with esophagitis, without bleeding: Secondary | ICD-10-CM | POA: Diagnosis not present

## 2022-10-17 DIAGNOSIS — K2210 Ulcer of esophagus without bleeding: Secondary | ICD-10-CM

## 2022-10-17 DIAGNOSIS — G4733 Obstructive sleep apnea (adult) (pediatric): Secondary | ICD-10-CM | POA: Diagnosis not present

## 2022-10-17 DIAGNOSIS — F418 Other specified anxiety disorders: Secondary | ICD-10-CM | POA: Diagnosis not present

## 2022-10-17 DIAGNOSIS — Z9989 Dependence on other enabling machines and devices: Secondary | ICD-10-CM | POA: Diagnosis not present

## 2022-10-17 DIAGNOSIS — I480 Paroxysmal atrial fibrillation: Secondary | ICD-10-CM | POA: Diagnosis not present

## 2022-10-17 DIAGNOSIS — B9681 Helicobacter pylori [H. pylori] as the cause of diseases classified elsewhere: Secondary | ICD-10-CM | POA: Diagnosis not present

## 2022-10-17 DIAGNOSIS — K208 Other esophagitis without bleeding: Secondary | ICD-10-CM | POA: Diagnosis not present

## 2022-10-17 DIAGNOSIS — K2970 Gastritis, unspecified, without bleeding: Secondary | ICD-10-CM | POA: Insufficient documentation

## 2022-10-17 HISTORY — PX: ESOPHAGOGASTRODUODENOSCOPY (EGD) WITH PROPOFOL: SHX5813

## 2022-10-17 SURGERY — ESOPHAGOGASTRODUODENOSCOPY (EGD) WITH PROPOFOL
Anesthesia: General

## 2022-10-17 MED ORDER — GLYCOPYRROLATE 0.2 MG/ML IJ SOLN
INTRAMUSCULAR | Status: AC
  Filled 2022-10-17: qty 1

## 2022-10-17 MED ORDER — PROPOFOL 10 MG/ML IV BOLUS
INTRAVENOUS | Status: DC | PRN
  Administered 2022-10-17: 50 mg via INTRAVENOUS

## 2022-10-17 MED ORDER — LIDOCAINE HCL (CARDIAC) PF 100 MG/5ML IV SOSY
PREFILLED_SYRINGE | INTRAVENOUS | Status: DC | PRN
  Administered 2022-10-17: 50 mg via INTRAVENOUS

## 2022-10-17 MED ORDER — LIDOCAINE HCL (PF) 2 % IJ SOLN
INTRAMUSCULAR | Status: AC
  Filled 2022-10-17: qty 5

## 2022-10-17 MED ORDER — OMEPRAZOLE 40 MG PO CPDR: 40.0000 mg | DELAYED_RELEASE_CAPSULE | Freq: Two times a day (BID) | ORAL | 2 refills | Status: DC

## 2022-10-17 MED ORDER — GLYCOPYRROLATE 0.2 MG/ML IJ SOLN
INTRAMUSCULAR | Status: DC | PRN
  Administered 2022-10-17: .2 mg via INTRAVENOUS

## 2022-10-17 MED ORDER — SODIUM CHLORIDE 0.9 % IV SOLN
INTRAVENOUS | Status: DC
  Administered 2022-10-17: 1000 mL via INTRAVENOUS

## 2022-10-17 MED ORDER — PROPOFOL 1000 MG/100ML IV EMUL
INTRAVENOUS | Status: AC
  Filled 2022-10-17: qty 100

## 2022-10-17 MED ORDER — PROPOFOL 500 MG/50ML IV EMUL
INTRAVENOUS | Status: DC | PRN
  Administered 2022-10-17: 125 ug/kg/min via INTRAVENOUS

## 2022-10-17 NOTE — H&P (Signed)
Cephas Darby, MD [address]  [place]  [place], [place] [address]  Main: [phone]  Fax: [phone] Pager: [phone]  Primary Care Physician:  Maryland Pink, MD Primary Gastroenterologist:  Dr. Cephas Darby  Pre-Procedure History & Physical: HPI:  Denise [name] is a 76 y.o. female is here for an endoscopy.   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/2012   Paroxysmal A-fib (HCC)    Sinoatrial node dysfunction (HCC)    Sleep apnea    Syncope and collapse     Past Surgical History:  Procedure Laterality Date   BREAST BIOPSY Left 1989   CARDIAC CATHETERIZATION  2004   [place]   COLONOSCOPY WITH PROPOFOL N/A 04/02/2018   Procedure: COLONOSCOPY WITH PROPOFOL;  Surgeon: Manya Silvas, MD;  Location: [facility] ENDOSCOPY;  Service: Endoscopy;  Laterality: N/A;   EP IMPLANTABLE DEVICE N/A 03/21/2016   Procedure: PPM Generator Changeout;  Surgeon: Deboraha Sprang, MD;  Location: [place] CV LAB;  Service: Cardiovascular;  Laterality: N/A;   INSERT / REPLACE / REMOVE PACEMAKER  10/21/2003   [place]/ [employer]    Prior to Admission medications   Medication Sig Start Date End Date Taking? Authorizing Provider  amLODipine (NORVASC) 2.5 MG tablet Take 1 tablet (2.5 mg total) by mouth daily. 08/21/22   Shirley Friar, PA-C  cetirizine (ZYRTEC) 10 MG tablet Take 10 mg by mouth as needed. 05/05/19 10/15/22  [provider]  clobetasol cream (TEMOVATE) 9.73 % Apply 1 application  topically as needed (for rash). 06/08/14   [provider]  diphenhydrAMINE (BENADRYL) 25 MG tablet Take 25 mg by mouth every 6 (six) hours as needed for itching or allergies.    [provider]  EPINEPHrine 0.3 mg/0.3 mL IJ SOAJ injection Inject 0.3 mg into the muscle once. Allergic to red meat.     [provider]  fluticasone (FLONASE) 50 MCG/ACT nasal spray Place 1 spray into both nostrils as needed. 12/05/19   [provider]  ibuprofen (ADVIL,MOTRIN) 100 MG tablet Take 100 mg by mouth every 8 (eight) hours as needed for pain or fever.    [provider]  mupirocin ointment (BACTROBAN) 2 % Apply 1 application  topically as needed (for skin). 06/08/14   [provider]  triamcinolone (NASACORT) 55 MCG/ACT AERO nasal inhaler Place 2 sprays into the nose daily as needed. 12/17/17 10/15/22  [provider]    Allergies as of 10/15/2022 - Review Complete 10/15/2022  Allergen Reaction Noted   Meat extract Anaphylaxis, Hives, Swelling, and Other (See Comments) 02/23/2016   Beef (bovine) protein  01/21/2019   Other  01/21/2019   Penicillin g  01/21/2019   Pork allergy Other (See Comments) 01/21/2019   Penicillins Hives 08/06/2012    Family History  Problem Relation Age of Onset   Hypertension Mother    Hypertension Sister    Hypertension Sister    Breast cancer Neg Hx     Social History   Socioeconomic History   Marital status: Married    Spouse name: Not on file   Number of children: Not on file   Years of education: Not on file   Highest education level: Not on file  Occupational History   Not on file  Tobacco Use   Smoking  status: Never   Smokeless tobacco: Never  Vaping Use   Vaping Use: Never used  Substance and Sexual Activity   Alcohol use: No   Drug use: No   Sexual activity: Not on file  Other Topics Concern   Not on file  Social History Narrative   Not on file   Social Determinants of Health   Financial Resource Strain: Not on file  Food Insecurity: Not on file  Transportation Needs: Not on file  Physical Activity: Not on file  Stress: Not on file  Social Connections: Not on file  Intimate Partner Violence: Not on file    Review of Systems: See HPI, otherwise negative ROS  Physical Exam: BP (!) 176/89    Pulse 67   Temp (!) 96.1 F (35.6 C) (Temporal)   Resp 18   Ht '5\' 2"'$  (1.575 m)   Wt 86.6 kg   SpO2 97%   BMI 34.92 kg/m  General:   Alert,  pleasant and cooperative in NAD Head:  Normocephalic and atraumatic. Neck:  Supple; no masses or thyromegaly. Lungs:  Clear throughout to auscultation.    Heart:  Regular rate and rhythm. Abdomen:  Soft, nontender and nondistended. Normal bowel sounds, without guarding, and without rebound.   Neurologic:  Alert and  oriented x4;  grossly normal neurologically.  Impression/Plan: Denise [name] is here for an endoscopy to be performed for dysphagia  Risks, benefits, limitations, and alternatives regarding  endoscopy have been reviewed with the patient.  Questions have been answered.  All parties agreeable.   Sherri Sear, MD  10/17/2022, 9:01 AM

## 2022-10-17 NOTE — Anesthesia Procedure Notes (Addendum)
Procedure Name: MAC Date/Time: 10/17/2022 9:08 AM  Performed by: Tollie Eth, CRNAPre-anesthesia Checklist: Patient identified, Emergency Drugs available, Suction available and Patient being monitored Patient Re-evaluated:Patient Re-evaluated prior to induction Oxygen Delivery Method: Simple face mask Induction Type: IV induction Placement Confirmation: positive ETCO2

## 2022-10-17 NOTE — Transfer of Care (Signed)
Immediate Anesthesia Transfer of Care Note  Patient: Denise [name]  Procedure(s) Performed: ESOPHAGOGASTRODUODENOSCOPY (EGD) WITH PROPOFOL  Patient Location: PACU and Endoscopy Unit  Anesthesia Type:General  Level of Consciousness: drowsy  Airway & Oxygen Therapy: Patient Spontanous Breathing  Post-op Assessment: Report given to RN  Post vital signs: Reviewed  Last Vitals:  Vitals Value Taken Time  BP 136/92 10/17/22 0927  Temp 36 C 10/17/22 0926  Pulse 66 10/17/22 0928  Resp 21 10/17/22 0928  SpO2 97 % 10/17/22 0928  Vitals shown include unvalidated device data.  Last Pain:  Vitals:   10/17/22 0926  TempSrc: Tympanic  PainSc: 0-No pain         Complications: No notable events documented.

## 2022-10-17 NOTE — Op Note (Signed)
[facility] Patient Name: Denise [name] Procedure Date: 10/17/2022 9:07 AM MRN: [id number] Account #: [id number] Date of Birth: 05-12-1946 Admit Type: Outpatient Age: 76 Room: [facility] ENDO ROOM 4 Gender: Female Note Status: Finalized Instrument Name: Upper Endoscope [id number] Procedure:             Upper GI endoscopy Indications:           Esophageal dysphagia Providers:             Lin Landsman MD, MD Medicines:             General Anesthesia Complications:         No immediate complications. Estimated blood loss: None. Procedure:             Pre-Anesthesia Assessment:                        - Prior to the procedure, a History and Physical was                         performed, and patient medications and allergies were                         reviewed. The patient is competent. The risks and                         benefits of the procedure and the sedation options and                         risks were discussed with the patient. All questions                         were answered and informed consent was obtained.                         Patient identification and proposed procedure were                         verified by the physician, the nurse, the                         anesthesiologist, the anesthetist and the technician                         in the pre-procedure area in the procedure room in the                         endoscopy suite. Mental Status Examination: alert and                         oriented. Airway Examination: normal oropharyngeal                         airway and neck mobility. Respiratory Examination:                         clear to auscultation. CV Examination: normal.                         Prophylactic Antibiotics: The  patient does not require                         prophylactic antibiotics. Prior Anticoagulants: The                         patient has taken no anticoagulant or antiplatelet                          agents. ASA Grade Assessment: III - A patient with                         severe systemic disease. After reviewing the risks and                         benefits, the patient was deemed in satisfactory                         condition to undergo the procedure. The anesthesia                         plan was to use general anesthesia. Immediately prior                         to administration of medications, the patient was                         re-assessed for adequacy to receive sedatives. The                         heart rate, respiratory rate, oxygen saturations,                         blood pressure, adequacy of pulmonary ventilation, and                         response to care were monitored throughout the                         procedure. The physical status of the patient was                         re-assessed after the procedure.                        After obtaining informed consent, the endoscope was                         passed under direct vision. Throughout the procedure,                         the patient's blood pressure, pulse, and oxygen                         saturations were monitored continuously. The                         Endosonoscope was introduced through the mouth, and  advanced to the second part of duodenum. The upper GI                         endoscopy was accomplished without difficulty. The                         patient tolerated the procedure well. Findings:      The duodenal bulb and second portion of the duodenum were normal.      The entire examined stomach was normal. Biopsies were taken with a cold       forceps for Helicobacter pylori testing.      The cardia and gastric fundus were normal on retroflexion.      LA Grade A (one or more mucosal breaks less than 5 mm, not extending       between tops of 2 mucosal folds) esophagitis with no bleeding was found       at the gastroesophageal junction.      The  examined esophagus was normal. Biopsies were taken with a cold       forceps for histology. Impression:            - Normal duodenal bulb and second portion of the                         duodenum.                        - Normal stomach. Biopsied.                        - LA Grade A reflux esophagitis with no bleeding.                        - Normal esophagus. Biopsied. Recommendation:        - Await pathology results.                        - Discharge patient to home (with escort).                        - Resume previous diet today.                        - Continue present medications.                        - Follow an antireflux regimen indefinitely.                        - Use a proton pump inhibitor PO BID for 8 weeks. Procedure Code(s):     --- Professional ---                        [phone], Esophagogastroduodenoscopy, flexible,                         transoral; with biopsy, single or multiple Diagnosis Code(s):     --- Professional ---                        K21.00, Gastro-esophageal reflux disease with  esophagitis, without bleeding                        R13.14, Dysphagia, pharyngoesophageal phase CPT copyright 2022 American Medical Association. All rights reserved. The codes documented in this report are preliminary and upon coder review may  be revised to meet current compliance requirements. Dr. Ulyess [facility] Lin Landsman MD, MD 10/17/2022 9:24:31 AM This report has been signed electronically. Number of Addenda: 0 Note Initiated On: 10/17/2022 9:07 AM Estimated Blood Loss:  Estimated blood loss: none.      [facility]

## 2022-10-17 NOTE — Anesthesia Preprocedure Evaluation (Signed)
Anesthesia Evaluation  Patient identified by MRN, date of birth, ID band Patient awake    Reviewed: Allergy & Precautions, NPO status , Patient's Chart, lab work & pertinent test results  History of Anesthesia Complications Negative for: history of anesthetic complications  Airway Mallampati: III  TM Distance: >3 FB Neck ROM: Full    Dental no notable dental hx.    Pulmonary neg shortness of breath, sleep apnea , neg COPD, neg recent URI   breath sounds clear to auscultation- rhonchi (-) wheezing      Cardiovascular Exercise Tolerance: Good (-) hypertension(-) angina (-) CAD, (-) Past MI and (-) Cardiac Stents + dysrhythmias Atrial Fibrillation + pacemaker  Rhythm:Regular Rate:Normal - Systolic murmurs and - Diastolic murmurs    Neuro/Psych  PSYCHIATRIC DISORDERS Anxiety Depression    negative neurological ROS     GI/Hepatic negative GI ROS, Neg liver ROS,,,  Endo/Other  negative endocrine ROSneg diabetes    Renal/GU negative Renal ROS     Musculoskeletal negative musculoskeletal ROS (+)    Abdominal  (+) + obese  Peds  Hematology negative hematology ROS (+)   Anesthesia Other Findings Past Medical History: No date: Anxiety No date: Arrhythmia     Comment:  a-fib No date: Depressive disorder, not elsewhere classified No date: Heart disease, unspecified No date: Hives 08/14/2012: Pacemaker-[employer] No date: Paroxysmal A-fib ([place]) No date: Sinoatrial node dysfunction (HCC) No date: Sleep apnea No date: Syncope and collapse   Reproductive/Obstetrics                             Anesthesia Physical Anesthesia Plan  ASA: 3  Anesthesia Plan: General   Post-op Pain Management:    Induction: Intravenous  PONV Risk Score and Plan: 2 and Propofol infusion and TIVA  Airway Management Planned: Natural Airway  Additional Equipment:   Intra-op Plan:   Post-operative Plan:    Informed Consent: I have reviewed the patients History and Physical, chart, labs and discussed the procedure including the risks, benefits and alternatives for the proposed anesthesia with the patient or authorized representative who has indicated his/her understanding and acceptance.     Dental advisory given  Plan Discussed with: CRNA and Anesthesiologist  Anesthesia Plan Comments:         Anesthesia Quick Evaluation

## 2022-10-18 ENCOUNTER — Encounter: Payer: Self-pay | Admitting: Gastroenterology

## 2022-10-18 DIAGNOSIS — Z0100 Encounter for examination of eyes and vision without abnormal findings: Secondary | ICD-10-CM | POA: Diagnosis not present

## 2022-10-18 DIAGNOSIS — H5213 Myopia, bilateral: Secondary | ICD-10-CM | POA: Diagnosis not present

## 2022-10-18 LAB — SURGICAL PATHOLOGY

## 2022-10-19 ENCOUNTER — Telehealth: Payer: Self-pay

## 2022-10-19 MED ORDER — CLARITHROMYCIN 500 MG PO TABS: 500.0000 mg | ORAL_TABLET | Freq: Two times a day (BID) | ORAL | 0 refills | Status: AC

## 2022-10-19 MED ORDER — METRONIDAZOLE 500 MG PO TABS: 500.0000 mg | ORAL_TABLET | Freq: Three times a day (TID) | ORAL | 0 refills | Status: AC

## 2022-10-19 NOTE — Telephone Encounter (Signed)
-----   Message from Lin Landsman, MD sent at 10/19/2022  7:52 AM EST ----- Please inform patient that the pathology results from upper endoscopy confirm H. pylori gastritis Recommend treatment for it, please send in the prescription to treat H Pylori for 14days She is already on prilosec  Clarithromycin '500mg'$  BID Flagyl '500mg'$  TID  Please make a clinic follow up in 75month  Thanks RV

## 2022-10-19 NOTE — Telephone Encounter (Signed)
Sent medication to the pharmacy. Patient verbalized understanding of results

## 2022-10-29 NOTE — Anesthesia Postprocedure Evaluation (Signed)
Anesthesia Post Note  Patient: Denise [name]  Procedure(s) Performed: ESOPHAGOGASTRODUODENOSCOPY (EGD) WITH PROPOFOL  Patient location during evaluation: Endoscopy Anesthesia Type: General Level of consciousness: awake and alert Pain management: pain level controlled Vital Signs Assessment: post-procedure vital signs reviewed and stable Respiratory status: spontaneous breathing, nonlabored ventilation, respiratory function stable and patient connected to nasal cannula oxygen Cardiovascular status: blood pressure returned to baseline and stable Postop Assessment: no apparent nausea or vomiting Anesthetic complications: no   No notable events documented.   Last Vitals:  Vitals:   10/17/22 0945 10/17/22 0948  BP: (!) 147/93 (!) 152/96  Pulse: 60   Resp: 20   Temp:    SpO2: 96%     Last Pain:  Vitals:   10/18/22 0750  TempSrc:   PainSc: 0-No pain                 Martha Clan

## 2022-12-19 ENCOUNTER — Ambulatory Visit
Admission: RE | Admit: 2022-12-19 | Discharge: 2022-12-19 | Disposition: A | Discharge status: Home / Self Care | Payer: Medicare HMO | Source: Ambulatory Visit | Attending: Family Medicine | Admitting: Family Medicine

## 2022-12-19 DIAGNOSIS — Z1231 Encounter for screening mammogram for malignant neoplasm of breast: Secondary | ICD-10-CM | POA: Insufficient documentation

## 2022-12-28 ENCOUNTER — Ambulatory Visit: Payer: Medicare HMO | Attending: Internal Medicine

## 2022-12-28 DIAGNOSIS — I442 Atrioventricular block, complete: Secondary | ICD-10-CM | POA: Diagnosis not present

## 2022-12-31 LAB — CUP PACEART REMOTE DEVICE CHECK
Battery Remaining Longevity: 102 mo
Battery Remaining Percentage: 96 %
Brady Statistic RA Percent Paced: 65 %
Brady Statistic RV Percent Paced: 0 %
Date Time Interrogation Session: 20240121171900
Implantable Lead Connection Status: 753985
Implantable Lead Connection Status: 753985
Implantable Lead Implant Date: 20041111
Implantable Lead Implant Date: 20041111
Implantable Lead Location: 753859
Implantable Lead Location: 753860
Implantable Lead Model: 4088
Implantable Lead Model: 4473
Implantable Lead Serial Number: 209644
Implantable Lead Serial Number: 420854
Implantable Pulse Generator Implant Date: 20170412
Lead Channel Impedance Value: 485 Ohm
Lead Channel Impedance Value: 627 Ohm
Lead Channel Pacing Threshold Amplitude: 1.3 V
Lead Channel Pacing Threshold Amplitude: 1.3 V
Lead Channel Pacing Threshold Pulse Width: 0.4 ms
Lead Channel Pacing Threshold Pulse Width: 0.4 ms
Lead Channel Setting Pacing Amplitude: 2.5 V
Lead Channel Setting Pacing Amplitude: 2.5 V
Lead Channel Setting Pacing Pulse Width: 0.4 ms
Lead Channel Setting Sensing Sensitivity: 2.5 mV
Pulse Gen Serial Number: 718133
Zone Setting Status: 755011

## 2023-01-14 NOTE — Progress Notes (Signed)
Remote pacemaker transmission.   

## 2023-01-16 DIAGNOSIS — M79605 Pain in left leg: Secondary | ICD-10-CM | POA: Diagnosis not present

## 2023-03-29 ENCOUNTER — Ambulatory Visit (INDEPENDENT_AMBULATORY_CARE_PROVIDER_SITE_OTHER): Payer: Medicare HMO

## 2023-03-29 DIAGNOSIS — I442 Atrioventricular block, complete: Secondary | ICD-10-CM | POA: Diagnosis not present

## 2023-03-29 LAB — CUP PACEART REMOTE DEVICE CHECK
Battery Remaining Longevity: 96 mo
Battery Remaining Percentage: 94 %
Brady Statistic RA Percent Paced: 63 %
Brady Statistic RV Percent Paced: 0 %
Date Time Interrogation Session: 20240419062300
Implantable Lead Connection Status: 753985
Implantable Lead Connection Status: 753985
Implantable Lead Implant Date: 20041111
Implantable Lead Implant Date: 20041111
Implantable Lead Location: 753859
Implantable Lead Location: 753860
Implantable Lead Model: 4088
Implantable Lead Model: 4473
Implantable Lead Serial Number: 209644
Implantable Lead Serial Number: 420854
Implantable Pulse Generator Implant Date: 20170412
Lead Channel Impedance Value: 468 Ohm
Lead Channel Impedance Value: 646 Ohm
Lead Channel Pacing Threshold Amplitude: 1.2 V
Lead Channel Pacing Threshold Amplitude: 1.3 V
Lead Channel Pacing Threshold Pulse Width: 0.4 ms
Lead Channel Pacing Threshold Pulse Width: 0.4 ms
Lead Channel Setting Pacing Amplitude: 2.5 V
Lead Channel Setting Pacing Amplitude: 2.5 V
Lead Channel Setting Pacing Pulse Width: 0.4 ms
Lead Channel Setting Sensing Sensitivity: 2.5 mV
Pulse Gen Serial Number: 718133
Zone Setting Status: 755011

## 2023-04-26 NOTE — Progress Notes (Signed)
Remote pacemaker transmission.   

## 2023-05-09 DIAGNOSIS — I10 Essential (primary) hypertension: Secondary | ICD-10-CM | POA: Diagnosis not present

## 2023-05-09 DIAGNOSIS — H6121 Impacted cerumen, right ear: Secondary | ICD-10-CM | POA: Diagnosis not present

## 2023-05-09 DIAGNOSIS — I872 Venous insufficiency (chronic) (peripheral): Secondary | ICD-10-CM | POA: Diagnosis not present

## 2023-05-27 DIAGNOSIS — R0989 Other specified symptoms and signs involving the circulatory and respiratory systems: Secondary | ICD-10-CM | POA: Diagnosis not present

## 2023-05-27 DIAGNOSIS — M542 Cervicalgia: Secondary | ICD-10-CM | POA: Diagnosis not present

## 2023-05-27 DIAGNOSIS — R0981 Nasal congestion: Secondary | ICD-10-CM | POA: Diagnosis not present

## 2023-05-27 DIAGNOSIS — R0602 Shortness of breath: Secondary | ICD-10-CM | POA: Diagnosis not present

## 2023-05-27 DIAGNOSIS — R600 Localized edema: Secondary | ICD-10-CM | POA: Diagnosis not present

## 2023-05-29 ENCOUNTER — Other Ambulatory Visit: Payer: Self-pay

## 2023-05-29 DIAGNOSIS — R0989 Other specified symptoms and signs involving the circulatory and respiratory systems: Secondary | ICD-10-CM

## 2023-05-29 DIAGNOSIS — M542 Cervicalgia: Secondary | ICD-10-CM

## 2023-06-05 DIAGNOSIS — J302 Other seasonal allergic rhinitis: Secondary | ICD-10-CM | POA: Diagnosis not present

## 2023-06-05 DIAGNOSIS — Z91018 Allergy to other foods: Secondary | ICD-10-CM | POA: Diagnosis not present

## 2023-06-05 DIAGNOSIS — J3489 Other specified disorders of nose and nasal sinuses: Secondary | ICD-10-CM | POA: Diagnosis not present

## 2023-06-21 ENCOUNTER — Encounter: Payer: Self-pay | Admitting: Emergency Medicine

## 2023-06-21 ENCOUNTER — Emergency Department: Payer: Medicare HMO

## 2023-06-21 ENCOUNTER — Other Ambulatory Visit: Payer: Self-pay

## 2023-06-21 ENCOUNTER — Emergency Department
Admission: EM | Admit: 2023-06-21 | Discharge: 2023-06-21 | Disposition: A | Discharge status: Home / Self Care | Payer: Medicare HMO | Attending: Emergency Medicine | Admitting: Emergency Medicine

## 2023-06-21 DIAGNOSIS — I671 Cerebral aneurysm, nonruptured: Secondary | ICD-10-CM | POA: Diagnosis not present

## 2023-06-21 DIAGNOSIS — Z950 Presence of cardiac pacemaker: Secondary | ICD-10-CM | POA: Insufficient documentation

## 2023-06-21 DIAGNOSIS — J811 Chronic pulmonary edema: Secondary | ICD-10-CM | POA: Diagnosis not present

## 2023-06-21 DIAGNOSIS — R42 Dizziness and giddiness: Secondary | ICD-10-CM | POA: Diagnosis present

## 2023-06-21 DIAGNOSIS — R600 Localized edema: Secondary | ICD-10-CM | POA: Diagnosis not present

## 2023-06-21 DIAGNOSIS — M542 Cervicalgia: Secondary | ICD-10-CM | POA: Insufficient documentation

## 2023-06-21 DIAGNOSIS — R0602 Shortness of breath: Secondary | ICD-10-CM | POA: Insufficient documentation

## 2023-06-21 DIAGNOSIS — M7989 Other specified soft tissue disorders: Secondary | ICD-10-CM | POA: Diagnosis not present

## 2023-06-21 DIAGNOSIS — I480 Paroxysmal atrial fibrillation: Secondary | ICD-10-CM | POA: Diagnosis not present

## 2023-06-21 DIAGNOSIS — I509 Heart failure, unspecified: Secondary | ICD-10-CM | POA: Diagnosis not present

## 2023-06-21 DIAGNOSIS — R9389 Abnormal findings on diagnostic imaging of other specified body structures: Secondary | ICD-10-CM | POA: Diagnosis not present

## 2023-06-21 DIAGNOSIS — H81399 Other peripheral vertigo, unspecified ear: Secondary | ICD-10-CM | POA: Diagnosis not present

## 2023-06-21 LAB — URINALYSIS, ROUTINE W REFLEX MICROSCOPIC
Bacteria, UA: NONE SEEN
Bilirubin Urine: NEGATIVE
Glucose, UA: NEGATIVE mg/dL
Ketones, ur: NEGATIVE mg/dL
Leukocytes,Ua: NEGATIVE
Nitrite: NEGATIVE
Protein, ur: NEGATIVE mg/dL
Specific Gravity, Urine: 1.006 (ref 1.005–1.030)
pH: 7 (ref 5.0–8.0)

## 2023-06-21 LAB — COMPREHENSIVE METABOLIC PANEL
ALT: 13 U/L (ref 0–44)
AST: 26 U/L (ref 15–41)
Albumin: 4.1 g/dL (ref 3.5–5.0)
Alkaline Phosphatase: 82 U/L (ref 38–126)
Anion gap: 6 (ref 5–15)
BUN: 24 mg/dL — ABNORMAL HIGH (ref 8–23)
CO2: 28 mmol/L (ref 22–32)
Calcium: 8.9 mg/dL (ref 8.9–10.3)
Chloride: 106 mmol/L (ref 98–111)
Creatinine, Ser: 0.87 mg/dL (ref 0.44–1.00)
GFR, Estimated: >60 mL/min (ref >60)
Glucose, Bld: 115 mg/dL — ABNORMAL HIGH (ref 70–99)
Potassium: 4.3 mmol/L (ref 3.5–5.1)
Sodium: 140 mmol/L (ref 135–145)
Total Bilirubin: 0.5 mg/dL (ref 0.3–1.2)
Total Protein: 6.9 g/dL (ref 6.5–8.1)

## 2023-06-21 LAB — CBC
HCT: 38.8 % (ref 36.0–46.0)
Hemoglobin: 12.7 g/dL (ref 12.0–15.0)
MCH: 30.6 pg (ref 26.0–34.0)
MCHC: 32.7 g/dL (ref 30.0–36.0)
MCV: 93.5 fL (ref 80.0–100.0)
Platelets: 157 10*3/uL (ref 150–400)
RBC: 4.15 MIL/uL (ref 3.87–5.11)
RDW: 15.9 % — ABNORMAL HIGH (ref 11.5–15.5)
WBC: 3.5 10*3/uL — ABNORMAL LOW (ref 4.0–10.5)
nRBC: 0 % (ref 0.0–0.2)

## 2023-06-21 LAB — BRAIN NATRIURETIC PEPTIDE: B Natriuretic Peptide: 7.5 pg/mL (ref 0.0–100.0)

## 2023-06-21 LAB — TSH: TSH: 1.243 u[IU]/mL (ref 0.350–4.500)

## 2023-06-21 LAB — TROPONIN I (HIGH SENSITIVITY): Troponin I (High Sensitivity): 3 ng/L (ref <18)

## 2023-06-21 MED ORDER — FUROSEMIDE 20 MG PO TABS: 20.0000 mg | ORAL_TABLET | Freq: Every day | ORAL | 0 refills | Status: DC

## 2023-06-21 MED ORDER — FUROSEMIDE 10 MG/ML IJ SOLN
40.0000 mg | Freq: Once | INTRAMUSCULAR | Status: AC
  Administered 2023-06-21: 40 mg via INTRAVENOUS
  Filled 2023-06-21: qty 4

## 2023-06-21 MED ORDER — IOHEXOL 350 MG/ML SOLN
75.0000 mL | Freq: Once | INTRAVENOUS | Status: AC | PRN
  Administered 2023-06-21: 75 mL via INTRAVENOUS

## 2023-06-21 NOTE — ED Triage Notes (Signed)
Brought over by [facility]. C/o bilateral leg edema with sob and new onset dizziness. Patient c/o of feeling near syncope.

## 2023-06-21 NOTE — ED Provider Triage Note (Signed)
Emergency Medicine Provider Triage Evaluation Note  Cowanda Vanderplaats, a 77 y.o. female  was evaluated in triage.  Pt complains of bilateral lower extremity edema as well as some mild shortness of breath.  Patient denies any acute pain or discomfort at this time.  Patient with a history of TAVR pacemaker, A-fib, OSA, presents to the ED for evaluation.  Review of Systems  Positive: BLE edema Negative: CP  Physical Exam  BP (!) 176/110 (BP Location: Left Arm)   Pulse 61   Temp 98.3 F (36.8 C) (Oral)   Resp 20   Ht 5\' 3"  (1.6 m)   Wt 86.2 kg   SpO2 94%   BMI 33.66 kg/m  Gen:   Awake, no distress  NAD Resp:  Normal effort CTA, MSK:   Moves extremities without difficulty BLE w/ 2+ pitting edema to the knees Other:    Medical Decision Making  Medically screening exam initiated at 11:40 AM.  Appropriate orders placed.  Georganne Slavey was informed that the remainder of the evaluation will be completed by another provider, this initial triage assessment does not replace that evaluation, and the importance of remaining in the ED until their evaluation is complete.  Patient with a history of A-fib and heart disease, presents to the ED for bilateral lower extremity edema.   Lissa Hoard, PA-C 06/21/23 1146

## 2023-06-21 NOTE — ED Triage Notes (Signed)
Pt brought in with co bilat leg swelling with co shob. No acute distress noted at this time.

## 2023-06-21 NOTE — Discharge Instructions (Addendum)
Take the Lasix for the next 5 days to help decrease the swelling in your legs.  You can also keep your legs elevated.  Your workup is overall reassuring.  There is no evidence of heart failure, hypothyroidism, or kidney failure.  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  You can follow-up with your regular doctor but do not need any treatment or further urgent workup for this.  Return to the ER for new, worsening, or persistent severe dizziness, shortness of breath, weakness or lightheadedness, worsening swelling, or any other new or worsening symptoms that concern you.

## 2023-06-21 NOTE — ED Provider Notes (Signed)
[facility] Provider Note    Event Date/Time   First MD Initiated Contact with Patient 06/21/23 1212     (approximate)   History   Leg Swelling   HPI  Denise [name] is a 77 y.o. female with a history of atrial fibrillation, sinus node atrial dysfunction status post pacemaker, and sleep apnea who presents with multiple complaints over the last several days.  The patient reports dizziness which she describes as feeling lightheaded.  She states that when she walks she feels like she has to hold onto things, but denies any sensation of movement or spinning.  She has no associated nausea or vomiting.  She has not actually passed out.  The patient also reports bilateral leg swelling over the last several days in addition to some shortness of breath.  She denies any chest pain, cough, or fever.  She denies any headache.  The patient also reports swelling in the veins in her neck along with some pain.  She states that several times when she has turned a certain way or reached for something she will get a shooting pain along the sides of her neck.  I reviewed the past medical records per the patient was most recently seen by cardiology at [facility] on 6/17 for follow-up.  She had an echo on 6/20 showing an EF greater than 55%.   Physical Exam   Triage Vital Signs: ED Triage Vitals  Encounter Vitals Group     BP 06/21/23 1130 (!) 176/110     Systolic BP Percentile --      Diastolic BP Percentile --      Pulse Rate 06/21/23 1130 61     Resp 06/21/23 1130 20     Temp 06/21/23 1130 98.3 F (36.8 C)     Temp Source 06/21/23 1130 Oral     SpO2 06/21/23 1130 94 %     Weight 06/21/23 1131 190 lb (86.2 kg)     Height 06/21/23 1131 5\' 3"  (1.6 m)     Head Circumference --      Peak Flow --      Pain Score 06/21/23 1131 0     Pain Loc --      Pain Education --      Exclude from Growth Chart --     Most recent vital signs: Vitals:   06/21/23 1536 06/21/23  1632  BP: (!) 160/90 (!) 150/84  Pulse: 70 68  Resp: 20   Temp: 98 F (36.7 C) 98.2 F (36.8 C)  SpO2: 96% 96%     General: Alert and oriented, no distress.  CV:  Good peripheral perfusion.  Resp:  Normal effort.  Lungs CTAB. Abd:  No distention.  Other:  EOMI.  PERRLA.  No photophobia.  No facial droop.  No ataxia.  Normal gait.  Motor and sensory intact in all extremities.  1+ bilateral lower extremity pitting edema.   ED Results / Procedures / Treatments   Labs (all labs ordered are listed, but only abnormal results are displayed) Labs Reviewed  CBC - Abnormal; Notable for the following components:      Result Value   WBC 3.5 (*)    RDW 15.9 (*)    All other components within normal limits  COMPREHENSIVE METABOLIC PANEL - Abnormal; Notable for the following components:   Glucose, Bld 115 (*)    BUN 24 (*)    All other components within normal limits  URINALYSIS, ROUTINE W  REFLEX MICROSCOPIC - Abnormal; Notable for the following components:   Color, Urine STRAW (*)    APPearance CLEAR (*)    Hgb urine dipstick SMALL (*)    All other components within normal limits  BRAIN NATRIURETIC PEPTIDE  TSH  TROPONIN I (HIGH SENSITIVITY)     EKG  ED ECG REPORT I, Dionne Bucy, the attending physician, personally viewed and interpreted this ECG.  Date: 06/21/2023 EKG Time: 1125 Rate: 60 Rhythm: Atrial rhythm QRS Axis: normal Intervals: normal ST/T Wave abnormalities: Nonspecific T wave abnormalities Narrative Interpretation: no evidence of acute ischemia; no significant change when compared to EKG of 08/21/2022    RADIOLOGY  Chest x-ray: I independently viewed and interpreted the images; there is no focal consolidation or edema  CT angio head/neck:   IMPRESSION:  1. No acute intracranial abnormality.  2. No large vessel occlusion or hemodynamically significant stenosis  in the head or neck.  3. 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    PROCEDURES:  Critical Care performed: No  Procedures   MEDICATIONS ORDERED IN ED: Medications  furosemide (LASIX) injection 40 mg (40 mg Intravenous Given 06/21/23 1341)  iohexol (OMNIPAQUE) 350 MG/ML injection 75 mL (75 mLs Intravenous Contrast Given 06/21/23 1516)     IMPRESSION / MDM / ASSESSMENT AND PLAN / ED COURSE  I reviewed the triage vital signs and the nursing notes.  77 year old female with PMH as noted above presents with several days of lightheadedness, bilateral lower extremity swelling, shortness of breath, as well as shooting pains in her neck.  On exam she is hypertensive with otherwise normal vital signs.  Thorough neurologic exam is normal.  There is 1+ bilateral lower extremity edema.  Differential diagnosis includes, but is not limited to:  Edema: Dependent edema, venous stasis, less likely CHF, kidney disease, hypothyroidism/myxedema.  It is symmetrical with no pain, erythema, or tenderness.  There is no evidence of DVT.  BNP is normal.  Troponin is negative.  Chest x-ray shows no evidence of pulmonary edema.  We will give a dose of IV Lasix.  I have ordered TSH.  Dizziness: Electrolyte abnormality, other metabolic cause, UTI or other infection, dehydration, versus possible CNS etiology.  Especially given the report of shooting pains in the neck and the relatively acute onset of the symptoms as well as the patient's age and comorbidities, we will obtain a CT angio of the head and neck to rule out vascular etiology.  Patient's presentation is most consistent with acute complicated illness / injury requiring diagnostic workup.  ----------------------------------------- 4:23 PM on 06/21/2023 -----------------------------------------  CT angio shows no acute findings.  There is a small ACA aneurysm which is does not require any emergent evaluation or treatment and is not related to the patient's symptoms today.  The patient states that she  has had good urine output with the Lasix and is already feeling significantly better.  She is ambulating with more ease.  She feels comfortable and would like to go home.  I think that this is reasonable.  Overall edema is likely dependent.  There is no evidence of acute CHF.  I have prescribed a short course of additional Lasix for home.  I recommend that she follow-up with her primary care provider.  I have counseled her on the results of the workup including the presence of the aneurysm.  I gave strict return precautions and she expressed understanding.    FINAL CLINICAL IMPRESSION(S) / ED DIAGNOSES  Final diagnoses:  Peripheral edema  Lightheadedness     Rx / DC Orders   ED Discharge Orders          Ordered    furosemide (LASIX) 20 MG tablet  Daily        06/21/23 1622             Note:  This document was prepared using Dragon voice recognition software and may include unintentional dictation errors.    Dionne Bucy, MD 06/21/23 1720

## 2023-06-24 ENCOUNTER — Other Ambulatory Visit: Payer: Self-pay | Admitting: Family Medicine

## 2023-06-24 DIAGNOSIS — M542 Cervicalgia: Secondary | ICD-10-CM

## 2023-06-24 DIAGNOSIS — R0989 Other specified symptoms and signs involving the circulatory and respiratory systems: Secondary | ICD-10-CM

## 2023-06-25 ENCOUNTER — Ambulatory Visit
Admission: RE | Admit: 2023-06-25 | Discharge: 2023-06-25 | Disposition: A | Discharge status: Home / Self Care | Payer: Medicare HMO | Source: Ambulatory Visit | Attending: Family Medicine | Admitting: Family Medicine

## 2023-06-25 DIAGNOSIS — M542 Cervicalgia: Secondary | ICD-10-CM | POA: Diagnosis not present

## 2023-06-25 DIAGNOSIS — S15392A Other specified injury of left internal jugular vein, initial encounter: Secondary | ICD-10-CM | POA: Diagnosis not present

## 2023-06-25 DIAGNOSIS — R0989 Other specified symptoms and signs involving the circulatory and respiratory systems: Secondary | ICD-10-CM | POA: Insufficient documentation

## 2023-06-27 ENCOUNTER — Telehealth: Payer: Self-pay

## 2023-06-27 NOTE — Telephone Encounter (Signed)
Transition Care Management Unsuccessful Follow-up Telephone Call  Date of discharge and from where:  [facility] 7/12  Attempts:  1st Attempt  Reason for unsuccessful TCM follow-up call:  No answer/busy   Lenard Forth [facility] Guide, [facility] [phone] [address] [place], [place], [place] [address] Phone: [phone] Email: [email or web address]

## 2023-06-27 NOTE — Telephone Encounter (Signed)
Transition Care Management Follow-up Telephone Call Date of discharge and from where: [facility] 7/12 How have you been since you were released from the hospital? Doing ok Any questions or concerns? No  Items Reviewed: Did the pt receive and understand the discharge instructions provided? Yes  Medications obtained and verified? Yes  Other? No  Any new allergies since your discharge? No  Dietary orders reviewed? No Do you have support at home? Yes    Follow up appointments reviewed:  PCP Hospital f/u appt confirmed? No  Scheduled to see  on  @ . Specialist Hospital f/u appt confirmed? No  Scheduled to see  on  @ . Are transportation arrangements needed? No  If their condition worsens, is the pt aware to call PCP or go to the Emergency Dept.? Yes Was the patient provided with contact information for the PCP's office or ED? Yes Was to pt encouraged to call back with questions or concerns? Yes

## 2023-06-28 ENCOUNTER — Ambulatory Visit (INDEPENDENT_AMBULATORY_CARE_PROVIDER_SITE_OTHER): Payer: Medicare HMO

## 2023-06-28 DIAGNOSIS — I442 Atrioventricular block, complete: Secondary | ICD-10-CM | POA: Diagnosis not present

## 2023-06-30 LAB — CUP PACEART REMOTE DEVICE CHECK
Battery Remaining Longevity: 96 mo
Battery Remaining Percentage: 93 %
Brady Statistic RA Percent Paced: 61 %
Brady Statistic RV Percent Paced: 0 %
Date Time Interrogation Session: 20240719045200
Implantable Lead Connection Status: 753985
Implantable Lead Connection Status: 753985
Implantable Lead Implant Date: 20041111
Implantable Lead Implant Date: 20041111
Implantable Lead Location: 753859
Implantable Lead Location: 753860
Implantable Lead Model: 4088
Implantable Lead Model: 4473
Implantable Lead Serial Number: 209644
Implantable Lead Serial Number: 420854
Implantable Pulse Generator Implant Date: 20170412
Lead Channel Impedance Value: 421 Ohm
Lead Channel Impedance Value: 584 Ohm
Lead Channel Pacing Threshold Amplitude: 1.3 V
Lead Channel Pacing Threshold Amplitude: 1.4 V
Lead Channel Pacing Threshold Pulse Width: 0.4 ms
Lead Channel Pacing Threshold Pulse Width: 0.4 ms
Lead Channel Setting Pacing Amplitude: 2.5 V
Lead Channel Setting Pacing Amplitude: 2.5 V
Lead Channel Setting Pacing Pulse Width: 0.4 ms
Lead Channel Setting Sensing Sensitivity: 2.5 mV
Pulse Gen Serial Number: 718133
Zone Setting Status: 755011

## 2023-07-11 NOTE — Progress Notes (Signed)
Remote pacemaker transmission.   

## 2023-09-27 ENCOUNTER — Ambulatory Visit (INDEPENDENT_AMBULATORY_CARE_PROVIDER_SITE_OTHER): Payer: Medicare HMO

## 2023-09-27 DIAGNOSIS — I442 Atrioventricular block, complete: Secondary | ICD-10-CM | POA: Diagnosis not present

## 2023-09-27 LAB — CUP PACEART REMOTE DEVICE CHECK
Battery Remaining Longevity: 90 mo
Battery Remaining Percentage: 91 %
Brady Statistic RA Percent Paced: 58 %
Brady Statistic RV Percent Paced: 0 %
Date Time Interrogation Session: 20241018045100
Implantable Lead Connection Status: 753985
Implantable Lead Connection Status: 753985
Implantable Lead Implant Date: 20041111
Implantable Lead Implant Date: 20041111
Implantable Lead Location: 753859
Implantable Lead Location: 753860
Implantable Lead Model: 4088
Implantable Lead Model: 4473
Implantable Lead Serial Number: 209644
Implantable Lead Serial Number: 420854
Implantable Pulse Generator Implant Date: 20170412
Lead Channel Impedance Value: 480 Ohm
Lead Channel Impedance Value: 623 Ohm
Lead Channel Pacing Threshold Amplitude: 1.3 V
Lead Channel Pacing Threshold Amplitude: 1.4 V
Lead Channel Pacing Threshold Pulse Width: 0.4 ms
Lead Channel Pacing Threshold Pulse Width: 0.4 ms
Lead Channel Setting Pacing Amplitude: 2.5 V
Lead Channel Setting Pacing Amplitude: 2.5 V
Lead Channel Setting Pacing Pulse Width: 0.4 ms
Lead Channel Setting Sensing Sensitivity: 2.5 mV
Pulse Gen Serial Number: 718133
Zone Setting Status: 755011

## 2023-10-01 DIAGNOSIS — T63484A Toxic effect of venom of other arthropod, undetermined, initial encounter: Secondary | ICD-10-CM | POA: Diagnosis not present

## 2023-10-11 NOTE — Progress Notes (Signed)
Remote pacemaker transmission.   

## 2023-12-27 ENCOUNTER — Ambulatory Visit: Payer: Medicare HMO

## 2023-12-27 DIAGNOSIS — I442 Atrioventricular block, complete: Secondary | ICD-10-CM | POA: Diagnosis not present

## 2023-12-27 LAB — CUP PACEART REMOTE DEVICE CHECK
Battery Remaining Longevity: 90 mo
Battery Remaining Percentage: 87 %
Brady Statistic RA Percent Paced: 58 %
Brady Statistic RV Percent Paced: 0 %
Date Time Interrogation Session: 20250117045100
Implantable Lead Connection Status: 753985
Implantable Lead Connection Status: 753985
Implantable Lead Implant Date: 20041111
Implantable Lead Implant Date: 20041111
Implantable Lead Location: 753859
Implantable Lead Location: 753860
Implantable Lead Model: 4088
Implantable Lead Model: 4473
Implantable Lead Serial Number: 209644
Implantable Lead Serial Number: 420854
Implantable Pulse Generator Implant Date: 20170412
Lead Channel Impedance Value: 462 Ohm
Lead Channel Impedance Value: 608 Ohm
Lead Channel Pacing Threshold Amplitude: 1.4 V
Lead Channel Pacing Threshold Amplitude: 1.4 V
Lead Channel Pacing Threshold Pulse Width: 0.4 ms
Lead Channel Pacing Threshold Pulse Width: 0.4 ms
Lead Channel Setting Pacing Amplitude: 2.5 V
Lead Channel Setting Pacing Amplitude: 2.5 V
Lead Channel Setting Pacing Pulse Width: 0.4 ms
Lead Channel Setting Sensing Sensitivity: 2.5 mV
Pulse Gen Serial Number: 718133
Zone Setting Status: 755011

## 2023-12-30 NOTE — Progress Notes (Unsigned)
Electrophysiology Office Note:   Date:  12/31/2023  ID:  Denise [name], [place] Aug 18, 1946, MRN [id number]  Primary Cardiologist: Sherryl Manges, MD Primary Heart Failure: None Electrophysiologist: Sherryl Manges, MD       History of Present Illness:   Denise [name] is a 78 y.o. female with h/o AF, SND/CHB s/p PPM, OSA on CPAP, dysphagia, chronic pain syndrome seen today for routine electrophysiology followup.   Since last being seen in our clinic the patient reports she has been doing well overall. She notes an episode of brief LE swelling in one leg and pain in the bottom of her foot. It resolved pretty quickly, <1 day.  She has not been taking her medications as prescribed. She reports she has not been wearing her CPAP either but her husband recently told her she was having apneic episodes at night  and she started wearing it again.  She denies chest pain, palpitations, dyspnea, PND, orthopnea, nausea, vomiting, dizziness, syncope, edema, weight gain, or early satiety.   Review of systems complete and found to be negative unless listed in HPI.   EP Information / Studies Reviewed:    EKG is not ordered today. EKG from 06/24/23 reviewed which showed AP 60 bpm      PPM Interrogation-  reviewed in detail today,  See PACEART report.  Device History: Field seismologist PPM implanted 2004, gen change 03/21/2016, gen change 10/21/2023 for CHB  Studies:  ECHO 12/2019 > LVEF 60-65% LE VAS with ABI 01/2022 > no evidence of significant LE arterial disease    Risk Assessment/Calculations:     HYPERTENSION CONTROL Vitals:   12/31/23 0826 12/31/23 0831  BP: (!) 170/90 (!) 166/88    The patient's blood pressure is elevated above target today.  In order to address the patient's elevated BP: A current anti-hypertensive medication was adjusted today.           Physical Exam:   VS:  BP (!) 166/88   Pulse 60   Ht 5\' 3"  (1.6 m)   Wt 195 lb 6.4 oz (88.6 kg)   SpO2 97%   BMI 34.61  kg/m    Wt Readings from Last 3 Encounters:  12/31/23 195 lb 6.4 oz (88.6 kg)  06/21/23 190 lb (86.2 kg)  10/17/22 190 lb 15.1 oz (86.6 kg)     GEN: Well nourished, well developed in no acute distress NECK: No JVD; No carotid bruits CARDIAC: Regular rate and rhythm, no murmurs, rubs, gallops RESPIRATORY:  Clear to auscultation without rales, wheezing or rhonchi  ABDOMEN: Soft, non-tender, non-distended EXTREMITIES:  No edema; No deformity   ASSESSMENT AND PLAN:    CHB s/p [employer] PPM  -Normal PPM function  -See Pace Art report -No changes today -Public relations account executive discussed via Industry with patient   -known high atrial threshold, not dependent. Pt set on trend (monitoring of daily RA / RV thresholds but does not adjust) and over the past year she has been ~ 1.4V consistently. Consider adjustment to safety margin at next visit but has almost 1 V currently & pt not dependent with stable lead.  Reviewed with Industry.   Hypertension  -she reports she has not been taking her medications at all  -Norvasc 2.5 mg on MAR > increased to 5mg  daily and asked patient to record BP and follow up within 1 week with PCP (follows with Dr. Robby Sermon in [place])   Disposition:   Follow up with Dr. Graciela Husbands in 6 months as patient  wanted to see Dr. Graciela Husbands before retirement.   Signed, Canary Brim, NP-C, AGACNP-BC [facility] - Electrophysiology  12/31/2023, 1:17 PM

## 2023-12-31 ENCOUNTER — Encounter: Payer: Self-pay | Admitting: Pulmonary Disease

## 2023-12-31 ENCOUNTER — Ambulatory Visit: Payer: Medicare HMO | Attending: Pulmonary Disease | Admitting: Pulmonary Disease

## 2023-12-31 VITALS — BP 166/88 | HR 60 | Ht 63.0 in | Wt 195.4 lb

## 2023-12-31 DIAGNOSIS — I442 Atrioventricular block, complete: Secondary | ICD-10-CM

## 2023-12-31 DIAGNOSIS — Z950 Presence of cardiac pacemaker: Secondary | ICD-10-CM | POA: Diagnosis not present

## 2023-12-31 DIAGNOSIS — I10 Essential (primary) hypertension: Secondary | ICD-10-CM

## 2023-12-31 LAB — CUP PACEART INCLINIC DEVICE CHECK
Date Time Interrogation Session: 20250121101913
Implantable Lead Connection Status: 753985
Implantable Lead Connection Status: 753985
Implantable Lead Implant Date: 20041111
Implantable Lead Implant Date: 20041111
Implantable Lead Location: 753859
Implantable Lead Location: 753860
Implantable Lead Model: 4088
Implantable Lead Model: 4473
Implantable Lead Serial Number: 209644
Implantable Lead Serial Number: 420854
Implantable Pulse Generator Implant Date: 20170412
Lead Channel Impedance Value: 467 Ohm
Lead Channel Impedance Value: 620 Ohm
Lead Channel Pacing Threshold Amplitude: 1.1 V
Lead Channel Pacing Threshold Amplitude: 1.7 V
Lead Channel Pacing Threshold Pulse Width: 0.4 ms
Lead Channel Pacing Threshold Pulse Width: 0.4 ms
Lead Channel Sensing Intrinsic Amplitude: 24.6 mV
Lead Channel Sensing Intrinsic Amplitude: 5.2 mV
Lead Channel Setting Pacing Amplitude: 2.5 V
Lead Channel Setting Pacing Amplitude: 2.5 V
Lead Channel Setting Pacing Pulse Width: 0.4 ms
Lead Channel Setting Sensing Sensitivity: 2.5 mV
Pulse Gen Serial Number: 718133
Zone Setting Status: 755011

## 2023-12-31 MED ORDER — AMLODIPINE BESYLATE 5 MG PO TABS: 5.0000 mg | ORAL_TABLET | Freq: Every day | ORAL | 3 refills | Status: AC

## 2023-12-31 NOTE — Patient Instructions (Addendum)
  Medication Instructions:    START TAKING : NORVASC 5 MG ONCE A DAY    *If you need a refill on your cardiac medications before your next appointment, please call your pharmacy*     Lab Work: NONE ORDERED  TODAY    If you have labs (blood work) drawn today and your tests are completely normal, you will receive your results only by: MyChart Message (if you have MyChart) OR A paper copy in the mail If you have any lab test that is abnormal or we need to change your treatment, we will call you to review the results.   Testing/Procedures: NONE ORDERED  TODAY     Follow-Up: At [facility], you and your health needs are our priority.  As part of our continuing mission to provide you with exceptional heart care, we have created designated Provider Care Teams.  These Care Teams include your primary Cardiologist (physician) and Advanced Practice Providers (APPs -  Physician Assistants and Nurse Practitioners) who all work together to provide you with the care you need, when you need it.  We recommend signing up for the patient portal called "MyChart".  Sign up information is provided on this After Visit Summary.  MyChart is used to connect with patients for Virtual Visits (Telemedicine).  Patients are able to view lab/test results, encounter notes, upcoming appointments, etc.  Non-urgent messages can be sent to your provider as well.   To learn more about what you can do with MyChart, go to [email or web address].    Your next appointment:  MAKE SURE YOU FOLLOW UP PRIMARY PROVIDER   6 month(s)  Provider:   Sherryl Manges, MD     Other Instructions  PLEASE MAKE SURE YOU KEEP A LOG OF BLOOD PRESSURE  FOR NEXT WEEK AND CONTACT PRIMARY PROVIDER  WITH THOSE READINGS.

## 2024-01-31 NOTE — Progress Notes (Signed)
 Remote pacemaker transmission.

## 2024-02-05 ENCOUNTER — Encounter: Payer: Self-pay | Admitting: Internal Medicine

## 2024-03-27 ENCOUNTER — Ambulatory Visit (INDEPENDENT_AMBULATORY_CARE_PROVIDER_SITE_OTHER): Payer: Medicare HMO

## 2024-03-27 DIAGNOSIS — I442 Atrioventricular block, complete: Secondary | ICD-10-CM

## 2024-03-28 LAB — CUP PACEART REMOTE DEVICE CHECK
Battery Remaining Longevity: 90 mo
Battery Remaining Percentage: 85 %
Brady Statistic RA Percent Paced: 56 %
Brady Statistic RV Percent Paced: 0 %
Date Time Interrogation Session: 20250418045100
Implantable Lead Connection Status: 753985
Implantable Lead Connection Status: 753985
Implantable Lead Implant Date: 20041111
Implantable Lead Implant Date: 20041111
Implantable Lead Location: 753859
Implantable Lead Location: 753860
Implantable Lead Model: 4088
Implantable Lead Model: 4473
Implantable Lead Serial Number: 209644
Implantable Lead Serial Number: 420854
Implantable Pulse Generator Implant Date: 20170412
Lead Channel Impedance Value: 474 Ohm
Lead Channel Impedance Value: 627 Ohm
Lead Channel Pacing Threshold Amplitude: 1.3 V
Lead Channel Pacing Threshold Amplitude: 1.4 V
Lead Channel Pacing Threshold Pulse Width: 0.4 ms
Lead Channel Pacing Threshold Pulse Width: 0.4 ms
Lead Channel Setting Pacing Amplitude: 2.5 V
Lead Channel Setting Pacing Amplitude: 2.5 V
Lead Channel Setting Pacing Pulse Width: 0.4 ms
Lead Channel Setting Sensing Sensitivity: 2.5 mV
Pulse Gen Serial Number: 718133
Zone Setting Status: 755011

## 2024-04-06 ENCOUNTER — Encounter: Payer: Self-pay | Admitting: Cardiology

## 2024-05-06 DIAGNOSIS — I10 Essential (primary) hypertension: Secondary | ICD-10-CM | POA: Diagnosis not present

## 2024-05-06 DIAGNOSIS — R7303 Prediabetes: Secondary | ICD-10-CM | POA: Diagnosis not present

## 2024-05-06 DIAGNOSIS — R0609 Other forms of dyspnea: Secondary | ICD-10-CM | POA: Diagnosis not present

## 2024-05-06 DIAGNOSIS — R5383 Other fatigue: Secondary | ICD-10-CM | POA: Diagnosis not present

## 2024-05-06 DIAGNOSIS — M79605 Pain in left leg: Secondary | ICD-10-CM | POA: Diagnosis not present

## 2024-05-06 DIAGNOSIS — R5381 Other malaise: Secondary | ICD-10-CM | POA: Diagnosis not present

## 2024-05-06 NOTE — Addendum Note (Signed)
 Addended by: Lott Rouleau A on: 05/06/2024 08:44 AM   Modules accepted: Orders

## 2024-05-06 NOTE — Progress Notes (Signed)
 Remote pacemaker transmission.

## 2024-05-14 NOTE — Progress Notes (Signed)
  Electrophysiology Office Note:   ID:  Denise [name], Jennings January 13, 1946, MRN [id number]  Primary Cardiologist: Richardo Chandler, MD Electrophysiologist: Richardo Chandler, MD      History of Present Illness:   Denise [name] is a 78 y.o. female with h/o AF, SND/CHB s/p PPM, OSA on CPAP, dysphagia, chronic pain syndrome seen today for acute visit due to DOE per her PCP.    Patient reports gradually worsening dyspnea. She complains of fatigue and no energy, and SOB with most exertion. Uses her CPAP as directed. Denies much swelling. Overall not very active, no specific exercise. Disappointed that her dyspnea is not felt to be directly cardiac related.   Review of systems complete and found to be negative unless listed in HPI.   EP Information / Studies Reviewed:    EKG is ordered today. Personal review as below.  EKG Interpretation Date/Time:  Friday May 15 2024 11:27:08 EDT Ventricular Rate:  60 PR Interval:  160 QRS Duration:  60 QT Interval:  374 QTC Calculation: 374 R Axis:   42  Text Interpretation: Atrial-paced rhythm Nonspecific T wave abnormality When compared with ECG of 21-Jun-2023 11:25, No significant change was found Confirmed by Pilar Bridge [phone]) on 05/15/2024 11:39:20 AM    PPM Interrogation-  reviewed in detail today,  See PACEART report.  Arrhythmia/Device History Field seismologist PPM implanted 2004, gen change 03/21/2016, gen change 10/21/2023 for CHB   Physical Exam:   VS:  BP 117/71   Pulse 60   Ht 5' 4 (1.626 m)   Wt 196 lb (88.9 kg)   SpO2 96%   BMI 33.64 kg/m    Wt Readings from Last 3 Encounters:  05/15/24 196 lb (88.9 kg)  12/31/23 195 lb 6.4 oz (88.6 kg)  06/21/23 190 lb (86.2 kg)     GEN: No acute distress  NECK: No JVD; No carotid bruits CARDIAC: Regular rate and rhythm, no murmurs, rubs, gallops RESPIRATORY:  Clear to auscultation without rales, wheezing or rhonchi  ABDOMEN: Soft, non-tender, non-distended EXTREMITIES:  Trace  edema; No deformity   ASSESSMENT AND PLAN:    CHB / SND s/p [employer] PPM  Normal PPM function See Pace Art report No changes today  HTN Stable on current regimen   DOE Suspect multifactorial, No clear cardiac correlation. No overload or persistent arrhythmia.  Echo normal 2021, will update for completeness She is primarily A paced , Not concerned about PPM syndrome. Will refer to Pulmonology for unexplained dyspnea.   Disposition:   Follow up with EP Team in 6 months  Signed, Tylene Galla, PA-C

## 2024-05-15 ENCOUNTER — Ambulatory Visit: Attending: Student | Admitting: Student

## 2024-05-15 ENCOUNTER — Encounter: Payer: Self-pay | Admitting: Student

## 2024-05-15 VITALS — BP 117/71 | HR 60 | Ht 64.0 in | Wt 196.0 lb

## 2024-05-15 DIAGNOSIS — R062 Wheezing: Secondary | ICD-10-CM

## 2024-05-15 DIAGNOSIS — G4733 Obstructive sleep apnea (adult) (pediatric): Secondary | ICD-10-CM

## 2024-05-15 DIAGNOSIS — I10 Essential (primary) hypertension: Secondary | ICD-10-CM

## 2024-05-15 DIAGNOSIS — R0600 Dyspnea, unspecified: Secondary | ICD-10-CM | POA: Diagnosis not present

## 2024-05-15 DIAGNOSIS — I442 Atrioventricular block, complete: Secondary | ICD-10-CM

## 2024-05-15 LAB — CUP PACEART INCLINIC DEVICE CHECK
Date Time Interrogation Session: 20250606123717
Implantable Lead Connection Status: 753985
Implantable Lead Connection Status: 753985
Implantable Lead Implant Date: 20041111
Implantable Lead Implant Date: 20041111
Implantable Lead Location: 753859
Implantable Lead Location: 753860
Implantable Lead Model: 4088
Implantable Lead Model: 4473
Implantable Lead Serial Number: 209644
Implantable Lead Serial Number: 420854
Implantable Pulse Generator Implant Date: 20170412
Lead Channel Impedance Value: 513 Ohm
Lead Channel Impedance Value: 634 Ohm
Lead Channel Pacing Threshold Amplitude: 1 V
Lead Channel Pacing Threshold Amplitude: 1.7 V
Lead Channel Pacing Threshold Pulse Width: 0.4 ms
Lead Channel Pacing Threshold Pulse Width: 0.4 ms
Lead Channel Sensing Intrinsic Amplitude: 25 mV
Lead Channel Sensing Intrinsic Amplitude: 4.5 mV
Lead Channel Setting Pacing Amplitude: 2.5 V
Lead Channel Setting Pacing Amplitude: 2.5 V
Lead Channel Setting Pacing Pulse Width: 0.4 ms
Lead Channel Setting Sensing Sensitivity: 2.5 mV
Pulse Gen Serial Number: 718133
Zone Setting Status: 755011

## 2024-05-15 NOTE — Patient Instructions (Signed)
 Medication Instructions:  No medications changes today   *If you need a refill on your cardiac medications before your next appointment, please call your pharmacy*  Lab Work: No lab work today If you have labs (blood work) drawn today and your tests are completely normal, you will receive your results only by: MyChart Message (if you have MyChart) OR A paper copy in the mail If you have any lab test that is abnormal or we need to change your treatment, we will call you to review the results.  Testing/Procedures: Echocardiogram ordered today  Referral to: Pulmonology  Follow-Up: At [facility], you and your health needs are our priority.  As part of our continuing mission to provide you with exceptional heart care, our providers are all part of one team.  This team includes your primary Cardiologist (physician) and Advanced Practice Providers or APPs (Physician Assistants and Nurse Practitioners) who all work together to provide you with the care you need, when you need it.  Your next appointment:   6 month(s)  Provider:   You may see Richardo Chandler, MD or one of the following Advanced Practice Providers on your designated Care Team:   Mertha Abrahams, Kennard Pea "Jonelle Neri" [place], PA-C Suzann Riddle, NP Creighton Doffing, NP    We recommend signing up for the patient portal called "MyChart".  Sign up information is provided on this After Visit Summary.  MyChart is used to connect with patients for Virtual Visits (Telemedicine).  Patients are able to view lab/test results, encounter notes, upcoming appointments, etc.  Non-urgent messages can be sent to your provider as well.   To learn more about what you can do with MyChart, go to [email or web address].

## 2024-05-27 ENCOUNTER — Encounter: Payer: Self-pay | Admitting: Student

## 2024-06-02 DIAGNOSIS — Z780 Asymptomatic menopausal state: Secondary | ICD-10-CM | POA: Diagnosis not present

## 2024-06-02 DIAGNOSIS — Z23 Encounter for immunization: Secondary | ICD-10-CM | POA: Diagnosis not present

## 2024-06-02 DIAGNOSIS — Z0000 Encounter for general adult medical examination without abnormal findings: Secondary | ICD-10-CM | POA: Diagnosis not present

## 2024-06-02 DIAGNOSIS — I480 Paroxysmal atrial fibrillation: Secondary | ICD-10-CM | POA: Diagnosis not present

## 2024-06-02 DIAGNOSIS — G4733 Obstructive sleep apnea (adult) (pediatric): Secondary | ICD-10-CM | POA: Diagnosis not present

## 2024-06-02 DIAGNOSIS — R7303 Prediabetes: Secondary | ICD-10-CM | POA: Diagnosis not present

## 2024-06-02 DIAGNOSIS — I509 Heart failure, unspecified: Secondary | ICD-10-CM | POA: Diagnosis not present

## 2024-06-05 ENCOUNTER — Ambulatory Visit: Payer: Self-pay | Admitting: Cardiology

## 2024-06-26 ENCOUNTER — Ambulatory Visit: Payer: Medicare HMO

## 2024-06-26 DIAGNOSIS — I442 Atrioventricular block, complete: Secondary | ICD-10-CM | POA: Diagnosis not present

## 2024-06-29 LAB — CUP PACEART REMOTE DEVICE CHECK
Battery Remaining Longevity: 78 mo
Battery Remaining Percentage: 80 %
Brady Statistic RA Percent Paced: 55 %
Brady Statistic RV Percent Paced: 0 %
Date Time Interrogation Session: 20250718045100
Implantable Lead Connection Status: 753985
Implantable Lead Connection Status: 753985
Implantable Lead Implant Date: 20041111
Implantable Lead Implant Date: 20041111
Implantable Lead Location: 753859
Implantable Lead Location: 753860
Implantable Lead Model: 4088
Implantable Lead Model: 4473
Implantable Lead Serial Number: 209644
Implantable Lead Serial Number: 420854
Implantable Pulse Generator Implant Date: 20170412
Lead Channel Impedance Value: 518 Ohm
Lead Channel Impedance Value: 647 Ohm
Lead Channel Pacing Threshold Amplitude: 1.2 V
Lead Channel Pacing Threshold Amplitude: 1.3 V
Lead Channel Pacing Threshold Pulse Width: 0.4 ms
Lead Channel Pacing Threshold Pulse Width: 0.4 ms
Lead Channel Setting Pacing Amplitude: 2.5 V
Lead Channel Setting Pacing Amplitude: 2.5 V
Lead Channel Setting Pacing Pulse Width: 0.4 ms
Lead Channel Setting Sensing Sensitivity: 2.5 mV
Pulse Gen Serial Number: 718133
Zone Setting Status: 755011

## 2024-07-02 ENCOUNTER — Ambulatory Visit (HOSPITAL_COMMUNITY)
Admission: RE | Admit: 2024-07-02 | Discharge: 2024-07-02 | Disposition: A | Discharge status: Home / Self Care | Source: Ambulatory Visit | Attending: Cardiovascular Disease | Admitting: Cardiovascular Disease

## 2024-07-02 DIAGNOSIS — I442 Atrioventricular block, complete: Secondary | ICD-10-CM | POA: Insufficient documentation

## 2024-07-02 DIAGNOSIS — R0600 Dyspnea, unspecified: Secondary | ICD-10-CM | POA: Insufficient documentation

## 2024-07-02 LAB — ECHOCARDIOGRAM COMPLETE
Area-P 1/2: 2.68 cm2
S' Lateral: 2.46 cm

## 2024-07-13 ENCOUNTER — Telehealth: Payer: Self-pay | Admitting: Student

## 2024-07-13 NOTE — Telephone Encounter (Signed)
 Returned patient's phone call. Verified full name and DOB. Relayed the following results: Great news! EF is normal; Heart is mildly stiff which is a normal age related finding, especially in someone with h/o AF and HTN.   Nothing on echo to explain her SOB, and all in all very re-assuring.   No questions at the time. Patient appreciated call and verified understanding.  Josie RN

## 2024-07-13 NOTE — Telephone Encounter (Signed)
 Pt called in returning call about her echo results   [phone]

## 2024-08-06 ENCOUNTER — Ambulatory Visit (HOSPITAL_COMMUNITY)
Admission: RE | Admit: 2024-08-06 | Discharge: 2024-08-06 | Disposition: A | Discharge status: Home / Self Care | Source: Ambulatory Visit | Attending: Internal Medicine | Admitting: Internal Medicine

## 2024-08-06 ENCOUNTER — Encounter: Payer: Self-pay | Admitting: Internal Medicine

## 2024-08-06 ENCOUNTER — Ambulatory Visit (INDEPENDENT_AMBULATORY_CARE_PROVIDER_SITE_OTHER): Admitting: Internal Medicine

## 2024-08-06 ENCOUNTER — Other Ambulatory Visit (HOSPITAL_COMMUNITY)
Admission: RE | Admit: 2024-08-06 | Discharge: 2024-08-06 | Disposition: A | Discharge status: Home / Self Care | Source: Ambulatory Visit | Attending: Internal Medicine | Admitting: Internal Medicine

## 2024-08-06 VITALS — BP 131/81 | HR 61 | Ht 64.0 in | Wt 194.4 lb

## 2024-08-06 DIAGNOSIS — R0602 Shortness of breath: Secondary | ICD-10-CM | POA: Diagnosis not present

## 2024-08-06 DIAGNOSIS — R0609 Other forms of dyspnea: Secondary | ICD-10-CM

## 2024-08-06 DIAGNOSIS — Z950 Presence of cardiac pacemaker: Secondary | ICD-10-CM | POA: Diagnosis not present

## 2024-08-06 DIAGNOSIS — R9389 Abnormal findings on diagnostic imaging of other specified body structures: Secondary | ICD-10-CM | POA: Diagnosis not present

## 2024-08-06 NOTE — Progress Notes (Signed)
 Denise [name], female    DOB: [birth year]    MRN: [id number]   Brief patient profile:  48  yobf  never smoker  referred to pulmonary clinic in [place]  08/06/2024 by Dr Valora for new onset wheezingx early 2025  Moved to [place] from [place]  around 2015 (wt about 175) started farming and bitten by lots of ticks then noted severe reaction to beef  > pos alpha gal > then chronic rhinitis    Allergist in [place] > shots x 6 months  no better    Dg Es 04/03/22 No mass or stricture.  Relative narrowing at the gastroesophageal junction contributing to mild dysmotility, which was symptomatic. Consider endoscopy/manometry.  Spontaneous gastroesophageal reflux.  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  Pt not previously seen by PCCM service.     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    Also denies any obvious fluctuation of symptoms with weather or environmental changes or other aggravating or alleviating factors except as outlined above   No unusual exposure hx or h/o childhood pna/ asthma or knowledge of premature birth.  Current Allergies, Complete Past Medical History, Past Surgical History, Family History, and Social History were reviewed in [employer] record.  ROS  The following are not active complaints unless bolded Hoarseness, sore throat, dysphagia (globus sensation)  dental problems, itching,  sneezing,  nasal congestion or discharge of excess mucus or purulent secretions, ear ache,   fever, chills, sweats, unintended wt loss or wt gain, classically pleuritic or exertional cp,  orthopnea pnd or arm/hand swelling  or leg swelling, presyncope, palpitations, abdominal pain, anorexia, nausea, vomiting, diarrhea  or change in bowel habits or change in bladder habits, change in stools or change in urine, dysuria, hematuria,  rash, arthralgias, visual complaints, headache, numbness, weakness or ataxia or problems with walking or coordination,  change in mood or  memory.            Outpatient Medications Prior to Visit  Medication Sig Dispense Refill   amLODipine  (NORVASC ) 5 MG tablet Take 1 tablet (5 mg total) by mouth daily. 90 tablet 3   clobetasol cream (TEMOVATE) 0.05 % Apply 1 application  topically as needed (for rash).     diphenhydrAMINE  (BENADRYL ) 25 MG tablet Take 25 mg by mouth every 6 (six) hours as needed for itching or allergies.     EPINEPHrine 0.3 mg/0.3 mL IJ SOAJ injection Inject 0.3 mg into the muscle once. Allergic to red meat.     fluticasone (FLONASE) 50 MCG/ACT nasal spray Place 1 spray into both nostrils as needed.     mupirocin  ointment (BACTROBAN ) 2 % Apply 1 application  topically as needed (for skin).     cetirizine (ZYRTEC) 10 MG tablet Take 10 mg by mouth as needed. (Patient not taking: Reported on 08/06/2024)  No facility-administered medications prior to visit.    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/2012   Paroxysmal A-fib (HCC)    Sinoatrial node dysfunction (HCC)    Sleep apnea    Syncope and collapse       Objective:     BP 131/81   Pulse 61   Ht 5' 4 (1.626 m)   Wt 194 lb 6.4 oz (88.2 kg)   SpO2 98% Comment: ra  BMI 33.37 kg/m   SpO2: 98 % (ra)  amb mod obese (by BMI) stoic  bf nad    HEENT : Oropharynx  M4 airway / top denture     Nasal turbinates  mild edema / no polyps viz   NECK :  without  apparent JVD/ palpable Nodes/TM    LUNGS: no acc muscle use,  Nl contour chest with decreased bs on L o/w clear   CV:  RRR  no s3 or murmur or increase in P2, and no edema   ABD:  soft and nontender   MS:  Gait nl   ext warm without deformities Or obvious joint restrictions  calf tenderness, cyanosis or clubbing    SKIN: warm and dry without lesions    NEURO:  alert, approp, nl sensorium with  no motor or cerebellar deficits apparent.   CXR PA and Lateral:   08/06/2024 :    I personally reviewed images and impression is as follows:     Elevated L HD   since at least 2014 - no acute findings      Assessment   Assessment & Plan DOE (dyspnea on exertion) Onset early 2025 on background of alpha gal, chronic rhinitis and spont reflux on EGD - elevated L HD since at least 2014 which is > 10 y since symptoms so unlikely to be relevant  - 08/06/2024   Walked on RA  x  2.5  lap(s) =  approx 350  ft  @ fast  pace, stopped due to severe cough/ upper airway wheeze  with lowest 02 sats 94%   - DOE labs 08/06/2024 pending  - max rx for GERD and f/u PFTs rec 08/06/2024 >>>  Symptoms are markedly disproportionate to objective findings and not clear to what extent this is actually a pulmonary  problem but pt does appear to have difficult to sort out respiratory symptoms of unknown origin for which  DDX  = almost all start with A and  include Adherence, Ace Inhibitors, Acid Reflux, Active Sinus Disease, Alpha 1 Antitripsin deficiency, Anxiety/depression/ deconditioning  masquerading as Airways dz,  ABPA,  Allergy/Asthma (esp in young), Aspiration (esp in elderly), Adverse effects of meds,  Active smoking or Vaping, A bunch of PE's/clot burden (a few small clots can't cause this syndrome unless there is already severe underlying pulm or vascular dz with poor reserve),  Anemia or  thyroid  disorder, plus two Bs  = Bronchiectasis and Beta blocker use..and one C= CHF    BOLDED dx's of biggest concern in this never smoker with alpha gal allergy and subjective wheeze but not reproduced on today's walk nor any nocturnal sympts so start with DOE profile and max rx for gerd and f/u with all meds in hand using a trust but verify approach to confirm accurate Medication  Reconciliation The principal here  is that until we are certain that the  patients are doing what we've asked, it makes no sense to ask them to do more.   Hold off additional empirical rx for now and return in 6 weeks with all meds in hand using a trust but verify approach to confirm accurate Medication  Reconciliation The principal here is that until we are certain that the  patients are doing what we've asked, it makes no sense to ask them to do more.    Discussed in detail all the  indications, usual  risks and alternatives  relative to the benefits with patient who agrees to proceed with w/u and short term rx  as outlined.          AVS  Patient Instructions  GERD (REFLUX)  is an extremely common cause of respiratory symptoms just like yours , many times with no obvious heartburn at all.    It can be treated with medication, but also with lifestyle changes including elevation of the head of your bed (ideally with 6 -8inch blocks under the headboard of your bed),  Smoking cessation, avoidance of late meals, excessive alcohol, and avoid fatty foods, chocolate, peppermint, colas, red wine, and acidic juices such as orange juice.  NO MINT OR MENTHOL PRODUCTS SO NO COUGH DROPS  USE SUGARLESS CANDY INSTEAD (Jolley ranchers or Stover's or Life Savers) or even ice chips will also do - the key is to swallow to prevent all throat clearing. NO OIL BASED VITAMINS - use powdered substitutes.  Avoid fish oil when coughing.   Pantoprazole (protonix) 40 mg   Take  30-60 min before first meal of the day and Pepcid  (famotidine )  20  mg after supper until return to office - this is the best way to tell whether stomach acid is contributing to your problem.     Please remember to go to the lab department   for your tests - we will call you with the results when they are available.     Please remember to go to the  x-ray department  @  [facility] for your tests - we will call you with the results when they are available      Please schedule a follow up office visit in 6-8  weeks, call sooner if needed with all medications /inhalers/ solutions in hand so we can verify exactly what you are taking. This includes all medications from all doctors and over the counters      PFT's on return      Ozell America, MD 08/08/2024

## 2024-08-06 NOTE — Patient Instructions (Addendum)
 GERD (REFLUX)  is an extremely common cause of respiratory symptoms just like yours , many times with no obvious heartburn at all.    It can be treated with medication, but also with lifestyle changes including elevation of the head of your bed (ideally with 6 -8inch blocks under the headboard of your bed),  Smoking cessation, avoidance of late meals, excessive alcohol, and avoid fatty foods, chocolate, peppermint, colas, red wine, and acidic juices such as orange juice.  NO MINT OR MENTHOL PRODUCTS SO NO COUGH DROPS  USE SUGARLESS CANDY INSTEAD (Jolley ranchers or Stover's or Life Savers) or even ice chips will also do - the key is to swallow to prevent all throat clearing. NO OIL BASED VITAMINS - use powdered substitutes.  Avoid fish oil when coughing.   Pantoprazole (protonix) 40 mg   Take  30-60 min before first meal of the day and Pepcid  (famotidine )  20 mg after supper until return to office - this is the best way to tell whether stomach acid is contributing to your problem.     Please remember to go to the lab department   for your tests - we will call you with the results when they are available.     Please remember to go to the  x-ray department  @  [facility] for your tests - we will call you with the results when they are available      Please schedule a follow up office visit in 6-8  weeks, call sooner if needed with all medications /inhalers/ solutions in hand so we can verify exactly what you are taking. This includes all medications from all doctors and over the counters      PFT's on return

## 2024-08-07 DIAGNOSIS — Z780 Asymptomatic menopausal state: Secondary | ICD-10-CM | POA: Diagnosis not present

## 2024-08-08 NOTE — Assessment & Plan Note (Addendum)
 Onset early 2025 on background of alpha gal, chronic rhinitis and spont reflux on EGD - elevated L HD since at least 2014 which is > 10 y since symptoms so unlikely to be relevant  - 08/06/2024   Walked on RA  x  2.5  lap(s) =  approx 350  ft  @ fast  pace, stopped due to severe cough/ upper airway wheeze  with lowest 02 sats 94%   - DOE labs 08/06/2024 pending  - max rx for GERD and f/u PFTs rec 08/06/2024 >>>  Symptoms are markedly disproportionate to objective findings and not clear to what extent this is actually a pulmonary  problem but pt does appear to have difficult to sort out respiratory symptoms of unknown origin for which  DDX  = almost all start with A and  include Adherence, Ace Inhibitors, Acid Reflux, Active Sinus Disease, Alpha 1 Antitripsin deficiency, Anxiety/depression/ deconditioning  masquerading as Airways dz,  ABPA,  Allergy/Asthma (esp in young), Aspiration (esp in elderly), Adverse effects of meds,  Active smoking or Vaping, A bunch of PE's/clot burden (a few small clots can't cause this syndrome unless there is already severe underlying pulm or vascular dz with poor reserve),  Anemia or thyroid  disorder, plus two Bs  = Bronchiectasis and Beta blocker use..and one C= CHF    BOLDED dx's of biggest concern in this never smoker with alpha gal allergy and subjective wheeze but not reproduced on today's walk nor any nocturnal sympts so start with DOE profile and max rx for gerd and f/u with all meds in hand using a trust but verify approach to confirm accurate Medication  Reconciliation The principal here is that until we are certain that the  patients are doing what we've asked, it makes no sense to ask them to do more.   Hold off additional empirical rx for now and return in 6 weeks with all meds in hand using a trust but verify approach to confirm accurate Medication  Reconciliation The principal here is that until we are certain that the  patients are doing what we've asked, it makes  no sense to ask them to do more.    Discussed in detail all the  indications, usual  risks and alternatives  relative to the benefits with patient who agrees to proceed with w/u and short term rx  as outlined.

## 2024-08-10 ENCOUNTER — Ambulatory Visit: Payer: Self-pay | Admitting: Internal Medicine

## 2024-08-10 LAB — BASIC METABOLIC PANEL WITH GFR
BUN/Creatinine Ratio: 23 (ref 12–28)
BUN: 20 mg/dL (ref 8–27)
CO2: 22 mmol/L (ref 20–29)
Calcium: 9.1 mg/dL (ref 8.7–10.3)
Chloride: 104 mmol/L (ref 96–106)
Creatinine, Ser: 0.86 mg/dL (ref 0.57–1.00)
Glucose: 97 mg/dL (ref 70–99)
Potassium: 4.9 mmol/L (ref 3.5–5.2)
Sodium: 142 mmol/L (ref 134–144)
eGFR: 69 mL/min/1.73 (ref >59)

## 2024-08-10 LAB — CBC WITH DIFFERENTIAL/PLATELET
Basophils Absolute: 0 x10E3/uL (ref 0.0–0.2)
Basos: 1 %
EOS (ABSOLUTE): 0 x10E3/uL (ref 0.0–0.4)
Eos: 1 %
Hematocrit: 41.8 % (ref 34.0–46.6)
Hemoglobin: 13.2 g/dL (ref 11.1–15.9)
Immature Grans (Abs): 0 x10E3/uL (ref 0.0–0.1)
Immature Granulocytes: 0 %
Lymphocytes Absolute: 0.9 x10E3/uL (ref 0.7–3.1)
Lymphs: 27 %
MCH: 30.6 pg (ref 26.6–33.0)
MCHC: 31.6 g/dL (ref 31.5–35.7)
MCV: 97 fL (ref 79–97)
Monocytes Absolute: 0.4 x10E3/uL (ref 0.1–0.9)
Monocytes: 12 %
Neutrophils Absolute: 1.9 x10E3/uL (ref 1.4–7.0)
Neutrophils: 59 %
Platelets: 147 x10E3/uL — ABNORMAL LOW (ref 150–450)
RBC: 4.32 x10E6/uL (ref 3.77–5.28)
RDW: 13.4 % (ref 11.7–15.4)
WBC: 3.1 x10E3/uL — ABNORMAL LOW (ref 3.4–10.8)

## 2024-08-10 LAB — TSH: TSH: 1.09 u[IU]/mL (ref 0.450–4.500)

## 2024-08-10 LAB — IGE: IgE (Immunoglobulin E), Serum: 196 [IU]/mL (ref 6–495)

## 2024-08-10 LAB — D-DIMER, QUANTITATIVE: D-DIMER: 0.88 mg{FEU}/L — ABNORMAL HIGH (ref 0.00–0.49)

## 2024-08-10 LAB — BRAIN NATRIURETIC PEPTIDE: BNP: 5.4 pg/mL (ref 0.0–100.0)

## 2024-08-11 DIAGNOSIS — R062 Wheezing: Secondary | ICD-10-CM | POA: Diagnosis not present

## 2024-08-11 DIAGNOSIS — K219 Gastro-esophageal reflux disease without esophagitis: Secondary | ICD-10-CM | POA: Diagnosis not present

## 2024-08-11 DIAGNOSIS — I10 Essential (primary) hypertension: Secondary | ICD-10-CM | POA: Diagnosis not present

## 2024-08-11 NOTE — Progress Notes (Signed)
 Spoke with pt and relayed results, pt confirmed understanding. NFN

## 2024-08-17 NOTE — Progress Notes (Signed)
 Relayed results to pt and they confirmed understanding. nfn

## 2024-09-14 NOTE — Progress Notes (Signed)
 Remote PPM Transmission

## 2024-09-17 ENCOUNTER — Ambulatory Visit: Admitting: Internal Medicine

## 2024-09-18 DIAGNOSIS — M5441 Lumbago with sciatica, right side: Secondary | ICD-10-CM | POA: Diagnosis not present

## 2024-09-18 DIAGNOSIS — R1024 Suprapubic pain: Secondary | ICD-10-CM | POA: Diagnosis not present

## 2024-09-18 DIAGNOSIS — M79671 Pain in right foot: Secondary | ICD-10-CM | POA: Diagnosis not present

## 2024-09-25 ENCOUNTER — Ambulatory Visit (INDEPENDENT_AMBULATORY_CARE_PROVIDER_SITE_OTHER): Payer: Medicare HMO

## 2024-09-25 DIAGNOSIS — I442 Atrioventricular block, complete: Secondary | ICD-10-CM

## 2024-09-29 LAB — CUP PACEART REMOTE DEVICE CHECK
Battery Remaining Longevity: 78 mo
Battery Remaining Percentage: 74 %
Brady Statistic RA Percent Paced: 58 %
Brady Statistic RV Percent Paced: 0 %
Date Time Interrogation Session: 20251017051700
Implantable Lead Connection Status: 753985
Implantable Lead Connection Status: 753985
Implantable Lead Implant Date: 20041111
Implantable Lead Implant Date: 20041111
Implantable Lead Location: 753859
Implantable Lead Location: 753860
Implantable Lead Model: 4088
Implantable Lead Model: 4473
Implantable Lead Serial Number: 209644
Implantable Lead Serial Number: 420854
Implantable Pulse Generator Implant Date: 20170412
Lead Channel Impedance Value: 474 Ohm
Lead Channel Impedance Value: 646 Ohm
Lead Channel Pacing Threshold Amplitude: 1.3 V
Lead Channel Pacing Threshold Amplitude: 1.4 V
Lead Channel Pacing Threshold Pulse Width: 0.4 ms
Lead Channel Pacing Threshold Pulse Width: 0.4 ms
Lead Channel Setting Pacing Amplitude: 2.5 V
Lead Channel Setting Pacing Amplitude: 2.5 V
Lead Channel Setting Pacing Pulse Width: 0.4 ms
Lead Channel Setting Sensing Sensitivity: 2.5 mV
Pulse Gen Serial Number: 718133
Zone Setting Status: 755011

## 2024-10-01 NOTE — Addendum Note (Signed)
 Addended by: VICCI SELLER A on: 10/01/2024 02:39 PM   Modules accepted: Orders

## 2024-10-01 NOTE — Progress Notes (Signed)
 Remote pacemaker transmission.

## 2024-10-03 ENCOUNTER — Ambulatory Visit: Payer: Self-pay | Admitting: Cardiology

## 2024-10-11 ENCOUNTER — Ambulatory Visit: Payer: Self-pay | Admitting: Cardiology

## 2024-10-13 ENCOUNTER — Ambulatory Visit (HOSPITAL_COMMUNITY)
Admission: RE | Admit: 2024-10-13 | Discharge: 2024-10-13 | Disposition: A | Discharge status: Home / Self Care | Source: Ambulatory Visit | Attending: Internal Medicine | Admitting: Internal Medicine

## 2024-10-13 ENCOUNTER — Encounter: Payer: Self-pay | Admitting: Internal Medicine

## 2024-10-13 ENCOUNTER — Ambulatory Visit (INDEPENDENT_AMBULATORY_CARE_PROVIDER_SITE_OTHER): Admitting: Internal Medicine

## 2024-10-13 VITALS — BP 136/68 | HR 67 | Ht 64.0 in | Wt 191.2 lb

## 2024-10-13 DIAGNOSIS — R0609 Other forms of dyspnea: Secondary | ICD-10-CM

## 2024-10-13 LAB — PULMONARY FUNCTION TEST
DL/VA % pred: 99 %
DL/VA: 4.14 ml/min/mmHg/L
DLCO unc % pred: 51 %
DLCO unc: 9.34 ml/min/mmHg
FEF 25-75 Post: 0.83 L/s
FEF 25-75 Pre: 0.87 L/s
FEF2575-%Change-Post: -4 %
FEF2575-%Pred-Post: 59 %
FEF2575-%Pred-Pre: 61 %
FEV1-%Change-Post: 0 %
FEV1-%Pred-Post: 63 %
FEV1-%Pred-Pre: 63 %
FEV1-Post: 1.19 L
FEV1-Pre: 1.19 L
FEV1FVC-%Change-Post: 0 %
FEV1FVC-%Pred-Pre: 101 %
FEV6-%Change-Post: 0 %
FEV6-%Pred-Post: 65 %
FEV6-%Pred-Pre: 65 %
FEV6-Post: 1.57 L
FEV6-Pre: 1.56 L
FEV6FVC-%Change-Post: 0 %
FEV6FVC-%Pred-Post: 104 %
FEV6FVC-%Pred-Pre: 104 %
FVC-%Change-Post: 0 %
FVC-%Pred-Post: 62 %
FVC-%Pred-Pre: 62 %
FVC-Post: 1.58 L
FVC-Pre: 1.57 L
Post FEV1/FVC ratio: 75 %
Post FEV6/FVC ratio: 99 %
Pre FEV1/FVC ratio: 75 %
Pre FEV6/FVC Ratio: 99 %
RV % pred: 28 %
RV: 0.66 L
TLC % pred: 71 %
TLC: 3.51 L

## 2024-10-13 MED ORDER — ALBUTEROL SULFATE (2.5 MG/3ML) 0.083% IN NEBU
2.5000 mg | INHALATION_SOLUTION | Freq: Once | RESPIRATORY_TRACT | Status: AC
  Administered 2024-10-13: 2.5 mg via RESPIRATORY_TRACT

## 2024-10-13 NOTE — Progress Notes (Unsigned)
 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  Relative narrowing at the gastroesophageal junction contributing to mild dysmotility, which was symptomatic. Consider endoscopy/manometry.  Spontaneous gastroesophageal reflux.  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  Pt not previously seen by PCCM service.     History of Present Illness  08/06/2024  Pulmonary/ [name]  Chief Complaint  Patient presents with   Establish Care    Shob - wheezing  Dyspnea:  walking to gather eggs flat surface 150 ft and stops sometime half way due to sob and hears herself wheezing  Cough: none  Sleep: bed is flat  cpap x 5 years no noct resp symptoms SABA use: none  02: none   Patient Instructions  GERD diet reviewed, bed blocks rec  Pantoprazole (protonix) 40 mg   Take  30-60 min before first meal of the day and Pepcid  (famotidine )  20 mg after supper until return to [name] - this is the best way to tell whether stomach acid is contributing to your problem.   PFT's on return  Please schedule a follow up [name] visit in 6-8  weeks, call sooner if needed with all medications /inhalers/ solutions in hand    - DOE labs 08/06/2024 nl - Allergy screen  08/06/24  >  Eos 0.1/  IgE 196   10/13/2024  f/u ov/[name] re: *** maint on ***  did *** bring meds  Chief Complaint  Patient presents with   Shortness of Breath    Pft 11/4 F/u wheezing with pft   Dyspnea:  does fine  at grocery store/ uses hc longer distance carrying eggs  Cough: none / wheezes with exertion  Sleeping: bricks under headboards and sometimes cpap    resp cc  SABA use: none  02: none     No obvious day to day or daytime variability or assoc excess/ purulent sputum or mucus plugs or hemoptysis or cp or chest tightness, subjective wheeze or overt sinus or hb symptoms.    Also denies any obvious fluctuation of symptoms with weather or environmental changes or other aggravating or alleviating factors except as outlined above   No unusual exposure hx or h/o childhood pna/ asthma or knowledge of premature birth.  Current Allergies, Complete Past Medical History, Past Surgical History, Family History, and Social History were reviewed in [employer] record.  ROS  The following are not active complaints unless bolded Hoarseness, sore throat, dysphagia, dental problems, itching, sneezing,  nasal congestion or discharge of excess mucus or purulent secretions, ear ache,   fever, chills, sweats, unintended wt loss or wt gain, classically pleuritic or exertional cp,  orthopnea pnd or arm/hand swelling  or leg swelling, presyncope, palpitations, abdominal pain, anorexia, nausea, vomiting, diarrhea  or change in bowel habits or change in bladder habits, change in stools or change in urine, dysuria, hematuria,  rash,  arthralgias, visual complaints, headache, numbness, weakness or ataxia or problems with walking or coordination,  change in mood or  memory.        Current Meds  Medication Sig   amLODipine  (NORVASC ) 5 MG tablet Take 1 tablet (5 mg total) by mouth daily.   clobetasol cream (TEMOVATE) 0.05 % Apply 1 application  topically as needed (for rash).   diphenhydrAMINE  (BENADRYL ) 25 MG tablet Take 25 mg by mouth every 6 (six) hours as needed for itching or allergies.   EPINEPHrine 0.3 mg/0.3 mL IJ SOAJ injection Inject 0.3 mg into the muscle once. Allergic to red meat.    fluticasone (FLONASE) 50 MCG/ACT nasal spray Place 1 spray into both nostrils as needed.   mupirocin  ointment (BACTROBAN ) 2 % Apply 1 application  topically as needed (for skin).          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/2012   Paroxysmal A-fib (HCC)    Sinoatrial node dysfunction (HCC)    Sleep apnea    Syncope and collapse       Objective:       Wt Readings from Last 3 Encounters:  10/13/24 191 lb 3.2 oz (86.7 kg)  08/06/24 194 lb 6.4 oz (88.2 kg)  05/15/24 196 lb (88.9 kg)     Vital signs reviewed  10/13/2024  - Note at rest 02 sats  ***% on ***   General appearance:    obese amb  bf  pleasant  but curt  responses - mod obese by BMI       CXR PA and Lateral:   08/06/2024 :    I personally reviewed images and impression is as follows:     Elevated L HD   since at least 2014 - no acute findings      Assessment

## 2024-10-13 NOTE — Patient Instructions (Addendum)
 Keep working on your weight  to help offset the effects of your L diaphragm dysfunction   My office will be contacting you by phone for referral to allergy in [facility]   - if you don't hear back from my office within one week please call us  back or notify us  thru MyChart and we'll address it right away.   Follow up is as needed in this clinic

## 2024-10-14 NOTE — Assessment & Plan Note (Addendum)
 Onset early 2025 on background of alpha gal, chronic rhinitis and spont reflux on EGD - elevated L HD since at least 2014  - 08/06/2024   Walked on RA  x  2.5  lap(s) =  approx 350  ft  @ fast  pace, stopped due to severe cough/ upper airway wheeze  with lowest 02 sats 94%   - DOE labs 08/06/2024 nl - Allergy screen  08/06/24  >  Eos 0. 1/  IgE 196 > referred to allergy 10/13/2024  - max rx for GERD and f/u PFTs  10/13/2024 no obst, no resp to saba, and ERV 25%@ wt 191  - 10/13/2024   Walked on RA  x  3  lap(s) =  approx 450  ft  @ mod  pace, stopped due to end of study s c/o sob or wheeze with lowest 02 sats 87%      The desats with walking and ERV are likely due to the combiination of mod obesity plus L HD dysfunction being synergistic in terms of poor aeration at L base > rx is wt loss as pt refuses to consider plication which I thought she might consider  Will refer to allergy in [place] re high IgE and h/o alpha gal hx plus noct wheeze  though I suspect this is GERD related   Pulmonary f/u can be prn   Each maintenance medication was reviewed in detail including emphasizing most importantly the difference between maintenance and prns and under what circumstances the prns are to be triggered using an action plan format where appropriate.  Total time for H and P, chart review, counseling,  directly observing portions of ambulatory 02 saturation study/ and generating customized AVS unique to this office visit / same day charting = 35 min summary final regular f/u ov

## 2024-11-30 ENCOUNTER — Encounter: Payer: Self-pay | Admitting: Internal Medicine

## 2024-11-30 ENCOUNTER — Other Ambulatory Visit: Payer: Self-pay

## 2024-11-30 ENCOUNTER — Ambulatory Visit (INDEPENDENT_AMBULATORY_CARE_PROVIDER_SITE_OTHER): Admitting: Internal Medicine

## 2024-11-30 VITALS — BP 132/80 | HR 67 | Temp 98.2°F | Resp 18 | Ht 60.79 in | Wt 189.8 lb

## 2024-11-30 DIAGNOSIS — Z91014 Allergy to mammalian meats: Secondary | ICD-10-CM

## 2024-11-30 DIAGNOSIS — R0609 Other forms of dyspnea: Secondary | ICD-10-CM

## 2024-11-30 DIAGNOSIS — J3089 Other allergic rhinitis: Secondary | ICD-10-CM | POA: Diagnosis not present

## 2024-11-30 MED ORDER — CETIRIZINE HCL 10 MG PO TABS: 10.0000 mg | ORAL_TABLET | ORAL | 5 refills | Status: AC | PRN

## 2024-11-30 MED ORDER — FLUTICASONE PROPIONATE 50 MCG/ACT NA SUSP: 2.0000 | Freq: Every day | NASAL | 5 refills | Status: AC

## 2024-11-30 NOTE — Patient Instructions (Addendum)
 Other Allergic Rhinitis: - Use nasal saline rinses before nose sprays such as with Neilmed Sinus Rinse.  Use distilled water.   - Use Flonase  2 sprays each nostril daily. Aim upward and outward. - Use Zyrtec  10 mg daily as needed for runny nose, sneezing, itchy watery eyes.  Alpha Gal - please strictly avoid all mammalian meat.  Only eat chicken, seafood, turkey. - for SKIN only reaction, okay to take Zyrtec  10 mg every 12 hours as needed - for SKIN + ANY additional symptoms, OR IF concern for LIFE THREATENING reaction = Epipen Autoinjector EpiPen 0.3 mg. - If using Epinephrine autoinjector, call 911 or go to the ER.   Shortness of Breath - Likely multifactorial: some level of exercise intolerance and elevated left hemidiaphragm resulting in restricted lung mechanics.

## 2024-11-30 NOTE — Progress Notes (Addendum)
 "  NEW PATIENT  Date of Service/Encounter:  11/30/2024  Consult requested by: Valora Lynwood FALCON, MD   Subjective:   Denise [name] (DOB: 08/28/1946) is a 78 y.o. female who presents to the clinic on 11/30/2024 with a chief complaint of Establish Care (Pt presents to the office for a consult. Pt sees Pulmonology. Pt states having wheezing, and shortness of breath. Wants to be tested.) .    History obtained from: chart review and patient.   SOB: Started in Spring 2025 but it is worsening. No history of asthma.  No history of inhaler use.  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  Also notes wheezing that she describes as rattling sound and sensation of hard to breath.  Usually resolves with rest.   Occurs mostly with activity/exercise. Never hospitalized for it or had to go to the ER.  Seen by Pulm and referred to us  for allergy evaluation.    Rhinitis:  Started about 10 years ago  Symptoms include: nasal congestion, rhinorrhea, and post nasal drainage  Occurs seasonally-Spring/Fall  Potential triggers: not sure  Treatments tried:  Flonase /Zyrtec  PRN  Previous allergy testing: no History of sinus surgery: no Nonallergic triggers: cold air   History of Alpha Gal: Diagnosed about 10 years ago.  Initial reaction included itching/hives and felt like her throat was closing.  Also recalls passing out.  Went to the ER after but not hospitalized. Has had multiple tick bites [place].    Reviewed:  10/14/2024: seen by Dr Darlean Carder for DOE, referred to Allergy for high IgE and history of alpha gal.  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    Self interpreted PFT 10/2024: low FEV1, FVC and TLC indicative of restrictive lung disease.  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/2012   Paroxysmal A-fib (HCC)    Sinoatrial node dysfunction (HCC)    Sleep apnea    Syncope and collapse     Past Surgical History: Past Surgical History:  Procedure Laterality Date   BREAST BIOPSY Left 1989   CARDIAC CATHETERIZATION  2004   [place]    COLONOSCOPY WITH PROPOFOL  N/A 04/02/2018   Procedure: COLONOSCOPY WITH PROPOFOL ;  Surgeon: Viktoria Lamar DASEN, MD;  Location[facility] ENDOSCOPY;  Service: Endoscopy;  Laterality: N/A;   EP IMPLANTABLE DEVICE N/A 03/21/2016   Procedure: PPM Generator Changeout;  Surgeon: Elspeth JAYSON Sage, MD;  [facility] INVASIVE CV LAB;  Service: Cardiovascular;  Laterality: N/A;   ESOPHAGOGASTRODUODENOSCOPY (EGD) WITH PROPOFOL  N/A 10/17/2022   Procedure: ESOPHAGOGASTRODUODENOSCOPY (EGD) WITH PROPOFOL ;  Surgeon: Unk Corinn Skiff, MD;  Location: [facility] ENDOSCOPY;  Service: Gastroenterology;  Laterality: N/A;   INSERT / REPLACE / REMOVE PACEMAKER  10/21/2003   [place] / [employer]   PACEMAKER IMPLANT     twice    Family History: Family History  Problem Relation Age of Onset   Hypertension Mother    Asthma Sister    Hypertension Sister    Hypertension Sister    Breast cancer Neg Hx     Social History:  Flooring in bedroom: wood Pets: none Tobacco use/exposure: second hand exposure through  dad Job: farmer  Medication List:  Allergies as of 11/30/2024       Reactions   Meat Extract Anaphylaxis, Hives, Swelling, Other (See Comments)   Red meat. Makes her throat close and hives breakout.   Bovine (beef) Protein    Other    Penicillin G    Pork Allergy Other (See Comments)   Penicillins Hives        Medication List        Accurate as of November 30, 2024 10:03 AM. If you have any questions, ask your nurse or doctor.          amLODipine  5 MG tablet Commonly known as: NORVASC  Take 1 tablet (5 mg  total) by mouth daily.   cetirizine  10 MG tablet Commonly known as: ZYRTEC  Take 10 mg by mouth as needed.   clobetasol cream 0.05 % Commonly known as: TEMOVATE Apply 1 application  topically as needed (for rash).   diphenhydrAMINE  25 MG tablet Commonly known as: BENADRYL  Take 25 mg by mouth every 6 (six) hours as needed for itching or allergies.   EPINEPHrine 0.3 mg/0.3 mL Soaj injection Commonly known as: EPI-PEN Inject 0.3 mg into the muscle once. Allergic to red meat.   fluticasone  50 MCG/ACT nasal spray Commonly known as: FLONASE  Place 1 spray into both nostrils as needed.   mupirocin  ointment 2 % Commonly known as: BACTROBAN  Apply 1 application  topically as needed (for skin).   pantoprazole 40 MG tablet Commonly known as: PROTONIX Take 40 mg by mouth daily.         REVIEW OF SYSTEMS: Pertinent positives and negatives discussed in HPI.   Objective:   Physical Exam: BP 132/80 (BP Location: Left Arm, Patient Position: Sitting, Cuff Size: Normal)   Pulse 67   Temp 98.2 F (36.8 C) (Temporal)   Resp 18   Ht 5' 0.79 (1.544 m)   Wt 189 lb 12.8 oz (86.1 kg)   SpO2 93%   BMI 36.11 kg/m  Body mass index is 36.11 kg/m. GEN: alert, well developed HEENT: clear conjunctiva, nose with + mild inferior turbinate hypertrophy, pink nasal mucosa, slight clear rhinorrhea, + cobblestoning HEART: regular rate and rhythm, no murmur LUNGS: clear to auscultation bilaterally, no coughing, unlabored respiration ABDOMEN: soft, non distended  SKIN: no rashes or lesions  Spirometry:  Tracings reviewed. Her effort: It was hard to get consistent efforts and there is a question as to whether this reflects a maximal maneuver. FVC: 1.29L, 68% predicted FEV1: 1.06L, 72% predicted FEV1/FVC ratio: 82% Interpretation: Spirometry consistent with possible restrictive disease.  Please see scanned spirometry results for details.   Assessment:   1. Other allergic rhinitis   2.  Alpha-gal syndrome   3. Other form of dyspnea     Plan/Recommendations:  Other Allergic Rhinitis: - Due to turbinate hypertrophy, seasonal symptoms and unresponsive to over the counter meds, will perform aeroallergen testing to identify triggers. - Use nasal saline rinses before nose sprays such as with Neilmed Sinus Rinse.  Use distilled water.   - Use Flonase  2 sprays each nostril daily. Aim upward and outward. - Use Zyrtec  10 mg daily as needed for runny nose, sneezing, itchy watery eyes.  Alpha Gal - please strictly avoid all mammalian meat.  Only eat chicken, seafood, turkey. - Initial rxn: pork with throat closing, itching/hives, LOC.   - for SKIN only reaction, okay to take Zyrtec  10 mg every 12 hours as needed - for SKIN + ANY additional symptoms, OR  IF concern for LIFE THREATENING reaction = Epipen Autoinjector EpiPen 0.3 mg. - If using Epinephrine autoinjector, call 911 or go to the ER.   Shortness of Breath - Likely multifactorial: some level of exercise intolerance and elevated left hemidiaphragm resulting in restricted lung mechanics. Spirometry today without obstruction, possible restriction, suboptimal effort.  No prior hx of obstructive lung disease.    Follow up in 3 months. Arleta Blanch, MD [facility]    "

## 2024-12-03 LAB — ALLERGEN PROFILE WITH TOTAL IGE, RESPIRATORY-AREA 2
Alternaria Alternata IgE: <0.1 kU/L
Aspergillus Fumigatus IgE: <0.1 kU/L
Bermuda Grass IgE: 0.16 kU/L — AB
Cat Dander IgE: 0.11 kU/L — AB
Cedar, Mountain IgE: 0.21 kU/L — AB
Cladosporium Herbarum IgE: <0.1 kU/L
Cockroach, German IgE: 0.57 kU/L — AB
Common Silver Birch IgE: 0.12 kU/L — AB
Cottonwood IgE: 0.15 kU/L — AB
D Farinae IgE: <0.1 kU/L
D Pteronyssinus IgE: <0.1 kU/L
Dog Dander IgE: 0.26 kU/L — AB
Elm, American IgE: 0.19 kU/L — AB
Johnson Grass IgE: <0.1 kU/L
Maple/Box Elder IgE: 0.2 kU/L — AB
Mouse Urine IgE: <0.1 kU/L
Oak, White IgE: 0.15 kU/L — AB
Pecan, Hickory IgE: 0.16 kU/L — AB
Penicillium Chrysogen IgE: <0.1 kU/L
Pigweed, Rough IgE: <0.1 kU/L
Ragweed, Short IgE: 0.13 kU/L — AB
Sheep Sorrel IgE Qn: 0.21 kU/L — AB
Timothy Grass IgE: <0.1 kU/L
White Mulberry IgE: <0.1 kU/L

## 2024-12-03 LAB — ALPHA-GAL PANEL
Allergen Lamb IgE: 0.98 kU/L — AB
Beef IgE: 1.79 kU/L — AB
IgE (Immunoglobulin E), Serum: 135 [IU]/mL (ref 6–495)
O215-IgE Alpha-Gal: 2.68 kU/L — AB
Pork IgE: 1.04 kU/L — AB

## 2024-12-07 ENCOUNTER — Ambulatory Visit: Payer: Self-pay | Admitting: Internal Medicine

## 2024-12-25 ENCOUNTER — Ambulatory Visit: Payer: Medicare HMO

## 2024-12-25 DIAGNOSIS — I442 Atrioventricular block, complete: Secondary | ICD-10-CM

## 2024-12-28 LAB — CUP PACEART REMOTE DEVICE CHECK
Battery Remaining Longevity: 66 mo
Battery Remaining Percentage: 65 %
Brady Statistic RA Percent Paced: 54 %
Brady Statistic RV Percent Paced: 0 %
Date Time Interrogation Session: 20260116045000
Implantable Lead Connection Status: 753985
Implantable Lead Connection Status: 753985
Implantable Lead Implant Date: 20041111
Implantable Lead Implant Date: 20041111
Implantable Lead Location: 753859
Implantable Lead Location: 753860
Implantable Lead Model: 4088
Implantable Lead Model: 4473
Implantable Lead Serial Number: 209644
Implantable Lead Serial Number: 420854
Implantable Pulse Generator Implant Date: 20170412
Lead Channel Impedance Value: 482 Ohm
Lead Channel Impedance Value: 617 Ohm
Lead Channel Pacing Threshold Amplitude: 1.4 V
Lead Channel Pacing Threshold Amplitude: 1.4 V
Lead Channel Pacing Threshold Pulse Width: 0.4 ms
Lead Channel Pacing Threshold Pulse Width: 0.4 ms
Lead Channel Setting Pacing Amplitude: 2.5 V
Lead Channel Setting Pacing Amplitude: 2.5 V
Lead Channel Setting Pacing Pulse Width: 0.4 ms
Lead Channel Setting Sensing Sensitivity: 2.5 mV
Pulse Gen Serial Number: 718133
Zone Setting Status: 755011

## 2024-12-31 ENCOUNTER — Ambulatory Visit: Payer: Self-pay | Admitting: Cardiology

## 2024-12-31 NOTE — Progress Notes (Signed)
 Remote PPM Transmission

## 2025-03-01 ENCOUNTER — Ambulatory Visit: Admitting: Family Medicine

## 2025-03-26 ENCOUNTER — Ambulatory Visit: Payer: Medicare HMO

## 2025-06-25 ENCOUNTER — Ambulatory Visit: Payer: Medicare HMO
